# Patient Record
Sex: Female | Born: 1935 | Race: White | Hispanic: No | Smoking: Never smoker
Health system: Southern US, Community
[De-identification: ages and names within clinical notes are randomized; demographics above are authoritative.]

## PROBLEM LIST (undated history)

## (undated) DIAGNOSIS — I341 Nonrheumatic mitral (valve) prolapse: Secondary | ICD-10-CM

## (undated) DIAGNOSIS — R413 Other amnesia: Secondary | ICD-10-CM

## (undated) DIAGNOSIS — R51 Headache: Secondary | ICD-10-CM

## (undated) DIAGNOSIS — G4486 Cervicogenic headache: Secondary | ICD-10-CM

## (undated) DIAGNOSIS — I639 Cerebral infarction, unspecified: Secondary | ICD-10-CM

## (undated) DIAGNOSIS — M1990 Unspecified osteoarthritis, unspecified site: Secondary | ICD-10-CM

## (undated) DIAGNOSIS — K219 Gastro-esophageal reflux disease without esophagitis: Secondary | ICD-10-CM

## (undated) DIAGNOSIS — G629 Polyneuropathy, unspecified: Secondary | ICD-10-CM

## (undated) DIAGNOSIS — J45909 Unspecified asthma, uncomplicated: Secondary | ICD-10-CM

## (undated) DIAGNOSIS — M47812 Spondylosis without myelopathy or radiculopathy, cervical region: Secondary | ICD-10-CM

## (undated) DIAGNOSIS — Z87442 Personal history of urinary calculi: Secondary | ICD-10-CM

## (undated) DIAGNOSIS — D649 Anemia, unspecified: Secondary | ICD-10-CM

## (undated) DIAGNOSIS — J189 Pneumonia, unspecified organism: Secondary | ICD-10-CM

## (undated) DIAGNOSIS — R519 Headache, unspecified: Secondary | ICD-10-CM

## (undated) HISTORY — DX: Cervicogenic headache: G44.86

## (undated) HISTORY — PX: CERVICAL SPINE SURGERY: SHX589

## (undated) HISTORY — DX: Headache: R51

## (undated) HISTORY — DX: Unspecified asthma, uncomplicated: J45.909

## (undated) HISTORY — PX: ELBOW SURGERY: SHX618

## (undated) HISTORY — DX: Headache, unspecified: R51.9

## (undated) HISTORY — DX: Spondylosis without myelopathy or radiculopathy, cervical region: M47.812

## (undated) HISTORY — DX: Unspecified osteoarthritis, unspecified site: M19.90

## (undated) HISTORY — PX: CHOLECYSTECTOMY: SHX55

## (undated) HISTORY — PX: ABDOMINAL HYSTERECTOMY: SHX81

## (undated) HISTORY — DX: Gastro-esophageal reflux disease without esophagitis: K21.9

## (undated) HISTORY — DX: Nonrheumatic mitral (valve) prolapse: I34.1

## (undated) HISTORY — PX: BUNIONECTOMY: SHX129

## (undated) HISTORY — DX: Personal history of urinary calculi: Z87.442

## (undated) HISTORY — DX: Other amnesia: R41.3

## (undated) HISTORY — DX: Polyneuropathy, unspecified: G62.9

## (undated) HISTORY — PX: BACK SURGERY: SHX140

## (undated) HISTORY — PX: CATARACT EXTRACTION: SUR2

---

## 1998-05-13 ENCOUNTER — Encounter: Payer: Self-pay | Admitting: Family Medicine

## 1998-05-13 ENCOUNTER — Ambulatory Visit (HOSPITAL_COMMUNITY): Admission: RE | Admit: 1998-05-13 | Discharge: 1998-05-13 | Payer: Self-pay | Admitting: Family Medicine

## 1999-05-26 ENCOUNTER — Ambulatory Visit (HOSPITAL_COMMUNITY): Admission: RE | Admit: 1999-05-26 | Discharge: 1999-05-26 | Payer: Self-pay | Admitting: Family Medicine

## 1999-05-26 ENCOUNTER — Encounter: Payer: Self-pay | Admitting: Family Medicine

## 2000-05-26 ENCOUNTER — Ambulatory Visit (HOSPITAL_COMMUNITY): Admission: RE | Admit: 2000-05-26 | Discharge: 2000-05-26 | Payer: Self-pay | Admitting: Family Medicine

## 2000-05-26 ENCOUNTER — Encounter: Payer: Self-pay | Admitting: Family Medicine

## 2000-07-06 ENCOUNTER — Ambulatory Visit (HOSPITAL_COMMUNITY): Admission: RE | Admit: 2000-07-06 | Discharge: 2000-07-06 | Payer: Self-pay | Admitting: Gastroenterology

## 2000-07-06 ENCOUNTER — Encounter (INDEPENDENT_AMBULATORY_CARE_PROVIDER_SITE_OTHER): Payer: Self-pay

## 2000-09-07 ENCOUNTER — Encounter: Admission: RE | Admit: 2000-09-07 | Discharge: 2000-09-07 | Payer: Self-pay | Admitting: Gastroenterology

## 2000-09-07 ENCOUNTER — Encounter: Payer: Self-pay | Admitting: Gastroenterology

## 2001-05-30 ENCOUNTER — Encounter: Payer: Self-pay | Admitting: Family Medicine

## 2001-05-30 ENCOUNTER — Ambulatory Visit (HOSPITAL_COMMUNITY): Admission: RE | Admit: 2001-05-30 | Discharge: 2001-05-30 | Payer: Self-pay | Admitting: Family Medicine

## 2001-08-26 ENCOUNTER — Ambulatory Visit (HOSPITAL_COMMUNITY): Admission: RE | Admit: 2001-08-26 | Discharge: 2001-08-26 | Payer: Self-pay | Admitting: Family Medicine

## 2001-08-26 ENCOUNTER — Encounter: Payer: Self-pay | Admitting: Family Medicine

## 2002-05-31 ENCOUNTER — Ambulatory Visit (HOSPITAL_COMMUNITY): Admission: RE | Admit: 2002-05-31 | Discharge: 2002-05-31 | Payer: Self-pay | Admitting: Family Medicine

## 2002-05-31 ENCOUNTER — Encounter: Payer: Self-pay | Admitting: Family Medicine

## 2002-08-12 ENCOUNTER — Encounter: Payer: Self-pay | Admitting: Emergency Medicine

## 2002-08-12 ENCOUNTER — Emergency Department (HOSPITAL_COMMUNITY): Admission: EM | Admit: 2002-08-12 | Discharge: 2002-08-12 | Payer: Self-pay | Admitting: *Deleted

## 2003-06-04 ENCOUNTER — Ambulatory Visit (HOSPITAL_COMMUNITY): Admission: RE | Admit: 2003-06-04 | Discharge: 2003-06-04 | Payer: Self-pay | Admitting: Family Medicine

## 2003-09-17 ENCOUNTER — Other Ambulatory Visit: Admission: RE | Admit: 2003-09-17 | Discharge: 2003-09-17 | Payer: Self-pay | Admitting: Family Medicine

## 2003-10-10 ENCOUNTER — Ambulatory Visit (HOSPITAL_COMMUNITY): Admission: RE | Admit: 2003-10-10 | Discharge: 2003-10-10 | Payer: Self-pay | Admitting: Neurosurgery

## 2004-02-11 ENCOUNTER — Encounter: Admission: RE | Admit: 2004-02-11 | Discharge: 2004-02-11 | Payer: Self-pay | Admitting: Gastroenterology

## 2004-06-05 ENCOUNTER — Ambulatory Visit (HOSPITAL_COMMUNITY): Admission: RE | Admit: 2004-06-05 | Discharge: 2004-06-05 | Payer: Self-pay | Admitting: Family Medicine

## 2004-08-11 ENCOUNTER — Ambulatory Visit (HOSPITAL_COMMUNITY): Admission: RE | Admit: 2004-08-11 | Discharge: 2004-08-11 | Payer: Self-pay | Admitting: Family Medicine

## 2004-09-03 ENCOUNTER — Ambulatory Visit (HOSPITAL_COMMUNITY): Admission: RE | Admit: 2004-09-03 | Discharge: 2004-09-03 | Payer: Self-pay | Admitting: Neurosurgery

## 2004-09-22 ENCOUNTER — Encounter (INDEPENDENT_AMBULATORY_CARE_PROVIDER_SITE_OTHER): Payer: Self-pay | Admitting: *Deleted

## 2004-09-22 ENCOUNTER — Ambulatory Visit (HOSPITAL_COMMUNITY): Admission: RE | Admit: 2004-09-22 | Discharge: 2004-09-22 | Payer: Self-pay

## 2004-12-18 ENCOUNTER — Inpatient Hospital Stay (HOSPITAL_COMMUNITY): Admission: RE | Admit: 2004-12-18 | Discharge: 2004-12-19 | Payer: Self-pay | Admitting: Orthopaedic Surgery

## 2006-03-27 ENCOUNTER — Encounter: Admission: RE | Admit: 2006-03-27 | Discharge: 2006-03-27 | Payer: Self-pay | Admitting: Orthopaedic Surgery

## 2006-04-20 ENCOUNTER — Encounter: Payer: Self-pay | Admitting: Orthopaedic Surgery

## 2006-04-23 ENCOUNTER — Encounter (INDEPENDENT_AMBULATORY_CARE_PROVIDER_SITE_OTHER): Payer: Self-pay | Admitting: Specialist

## 2006-04-23 ENCOUNTER — Inpatient Hospital Stay (HOSPITAL_COMMUNITY): Admission: RE | Admit: 2006-04-23 | Discharge: 2006-04-24 | Payer: Self-pay | Admitting: Orthopaedic Surgery

## 2006-05-17 ENCOUNTER — Encounter: Admission: RE | Admit: 2006-05-17 | Discharge: 2006-05-17 | Payer: Self-pay | Admitting: Orthopaedic Surgery

## 2006-07-01 ENCOUNTER — Inpatient Hospital Stay (HOSPITAL_COMMUNITY): Admission: RE | Admit: 2006-07-01 | Discharge: 2006-07-04 | Payer: Self-pay | Admitting: Orthopaedic Surgery

## 2008-04-03 ENCOUNTER — Ambulatory Visit (HOSPITAL_COMMUNITY): Admission: RE | Admit: 2008-04-03 | Discharge: 2008-04-03 | Payer: Self-pay | Admitting: Family Medicine

## 2008-04-09 ENCOUNTER — Encounter: Admission: RE | Admit: 2008-04-09 | Discharge: 2008-04-09 | Payer: Self-pay | Admitting: Family Medicine

## 2009-10-08 ENCOUNTER — Encounter: Admission: RE | Admit: 2009-10-08 | Discharge: 2009-10-08 | Payer: Self-pay | Admitting: Family Medicine

## 2010-08-10 ENCOUNTER — Encounter: Payer: Self-pay | Admitting: Neurosurgery

## 2010-09-09 ENCOUNTER — Other Ambulatory Visit: Payer: Self-pay | Admitting: Surgery

## 2010-09-09 ENCOUNTER — Encounter (HOSPITAL_BASED_OUTPATIENT_CLINIC_OR_DEPARTMENT_OTHER)
Admission: RE | Admit: 2010-09-09 | Discharge: 2010-09-09 | Disposition: A | Discharge status: Home / Self Care | Payer: Medicare Other | Source: Ambulatory Visit | Attending: Surgery | Admitting: Surgery

## 2010-09-09 ENCOUNTER — Ambulatory Visit
Admission: RE | Admit: 2010-09-09 | Discharge: 2010-09-09 | Disposition: A | Discharge status: Home / Self Care | Payer: Medicare Other | Source: Ambulatory Visit | Attending: Surgery | Admitting: Surgery

## 2010-09-09 DIAGNOSIS — Z01810 Encounter for preprocedural cardiovascular examination: Secondary | ICD-10-CM | POA: Insufficient documentation

## 2010-09-09 DIAGNOSIS — Z01811 Encounter for preprocedural respiratory examination: Secondary | ICD-10-CM

## 2010-09-12 ENCOUNTER — Other Ambulatory Visit: Payer: Self-pay | Admitting: Surgery

## 2010-09-12 ENCOUNTER — Ambulatory Visit (HOSPITAL_BASED_OUTPATIENT_CLINIC_OR_DEPARTMENT_OTHER)
Admission: RE | Admit: 2010-09-12 | Discharge: 2010-09-12 | Disposition: A | Discharge status: Home / Self Care | Payer: Medicare Other | Source: Ambulatory Visit | Attending: Surgery | Admitting: Surgery

## 2010-09-12 DIAGNOSIS — D235 Other benign neoplasm of skin of trunk: Secondary | ICD-10-CM | POA: Insufficient documentation

## 2010-09-12 DIAGNOSIS — Z01818 Encounter for other preprocedural examination: Secondary | ICD-10-CM | POA: Insufficient documentation

## 2010-09-12 DIAGNOSIS — D1739 Benign lipomatous neoplasm of skin and subcutaneous tissue of other sites: Secondary | ICD-10-CM | POA: Insufficient documentation

## 2010-09-12 LAB — POCT HEMOGLOBIN-HEMACUE: Hemoglobin: 11.8 g/dL — ABNORMAL LOW (ref 12.0–15.0)

## 2010-09-14 NOTE — Op Note (Signed)
  NAMEBERNESE, DOFFING           ACCOUNT NO.:  [id number]  MEDICAL RECORD NO.:  [id number]            PATIENT TYPE:  LOCATION:                                 FACILITY:  PHYSICIAN:  Thornton Park. Daphine Deutscher, MD       DATE OF BIRTH:  DATE OF PROCEDURE:  09/12/2010 DATE OF DISCHARGE:                              OPERATIVE REPORT   PREOPERATIVE DIAGNOSIS:  Mass on the back and a nevus.  POSTOPERATIVE DIAGNOSIS:  Lipoma of the back and a nevus.  PROCEDURE:  Excision of lipoma and also excision of nevus, primary closure.  SURGEON:  Thornton Park. Daphine Deutscher, MD  ANESTHESIA:  Local MAC.  DESCRIPTION OF PROCEDURE:  This 77 year old lady was taken to room 8 at [facility] Day surgery on Friday, September 12, 2010.  She was placed in a lateral decubitus position left side down and the areas in question that had been previously marked and were painted with PCMX draped sterilely. They were infiltrated with 1% lidocaine.  A transverse incision was used to excise the lipoma, which came out in pieces, and was sent for permanence.  Appeared to be benign.  The other little nevus was down into the left with a midline, was cleared out after infiltrating with 1% lidocaine and with a longitudinal elliptical incision, and it was closed with 4-0 Vicryl subcutaneously.  Both were closed with Benzoin Steri- Strips.  The patient tolerated the procedure well and was taken to recovery room in satisfactory condition.  She was given a script for Vicodin to take for pain and will follow up in the office in about 3-4 weeks.     Thornton Park Daphine Deutscher, MD     MBM/MEDQ  D:  09/12/2010  T:  09/13/2010  Job:  [id number]  Electronically Signed by Luretha Murphy MD on 09/14/2010 05:00:16 PM

## 2010-12-05 NOTE — Op Note (Signed)
NAMEKAMIYA, ACORD             ACCOUNT NO.:  [id number]   MEDICAL RECORD NO.:  [id number]          PATIENT TYPE:  INP   LOCATION:  5021                         FACILITY:  [facility]   PHYSICIAN:  Sharolyn Douglas, M.D.        DATE OF BIRTH:  [birth year]   DATE OF PROCEDURE:  DATE OF DISCHARGE:  04/24/2006                                 OPERATIVE REPORT   DIAGNOSIS:  T9 compression fracture.   PROCEDURES:  1. T9 kyphoplasty.  2. Radiographic interpretation of fluoroscopic images used for      kyphoplasty.   SURGEON:  Sharolyn Douglas, MD.   ASSISTANT:  None.   ANESTHESIA:  General.   COMPLICATIONS:  None.   ESTIMATED BLOOD LOSS:  None.   INDICATIONS:  The patient is a pleasant 77 year old female with persistent  thoracic back pain.  She was found to have a T9 compression fracture with a  persistent cleft identified on MRI scan.  She has failed to respond to other  treatment modalities and now elects to undergo kyphoplasty at T9 in hopes of  improving her symptoms.  Risks, benefits and alternatives were reviewed.  She knows there are no guarantees.   PROCEDURE:  The patient was identified in the holding area.  After informed  consent, she was taken to the operating room.  She underwent general  endotracheal anesthesia without difficulty.  She was carefully turned prone  with bolsters under her chest and pelvis to bring about a postural reduction  of her kyphosis.  The back was prepped and draped in the usual sterile  fashion.  Biplanar fluoroscopy brought into the field.  True AP and lateral  images obtained of the T9 vertebral body.  Great care was taken to identify  this by counting the ribs and also counting up from the sacrum on the  lateral view.  Small stab incision made just to the lateral aspect of the  left T9 pedicle.  The Jamshidi needle was used to cannulate the T9 pedicle  under live biplanar fluoroscopy.  A guidewire was established and a working  cannula created into the  vertebral body through the pedicle.  We then took a  core bone biopsy and sent this to pathology.  The balloon tamp was then  inserted and inflated to 4 mL.  The pressure went up to 400 PSI.  The  balloon tamp was removed and partially hardened methyl methacrylate cement  was pushed through the working  cannula, a total of 4 mL.  We had a good cement fill.  The working cannula  was removed.  The small stab incision was closed with a single nylon suture.  Sterile dressing applied.  Patient was turned supine, transferred to  recovery in stable condition, able to move her upper and lower extremities.      Sharolyn Douglas, M.D.  Electronically Signed     MC/MEDQ  D:  04/23/2006  T:  04/25/2006  Job:  [id number]

## 2010-12-05 NOTE — Op Note (Signed)
NAMECHE, BELOW             ACCOUNT NO.:  [id number]   MEDICAL RECORD NO.:  [id number]          PATIENT TYPE:  AMB   LOCATION:  DAY                          FACILITY:  [facility]   PHYSICIAN:  Lorre Munroe., M.D.DATE OF BIRTH:  1933-03-30   DATE OF PROCEDURE:  09/22/2004  DATE OF DISCHARGE:                                 OPERATIVE REPORT   PREOPERATIVE DIAGNOSIS:  Symptomatic gallstones with chronic cholecystitis.   POSTOPERATIVE DIAGNOSIS:  Symptomatic gallstones with chronic cholecystitis.   OPERATION:  Laparoscopic cholecystectomy with operative cholangiogram.   SURGEON:  Lebron Conners, M.D.   ANESTHESIA:  General.   PROCEDURE:  After the patient was monitored and anesthetized and had routine  preparation and draping of the abdomen, I liberally infused local anesthetic  just below the umbilicus and subsequently at three additional sites for my  ports. I made a 2 cm vertical incision just below the umbilicus, dissected  down through the fat to the subcutaneous tissue, and identified the midline  fascia. Incision of the midline fascia was found to be slightly off midline  and there was one bit of scar tissue present due to previous laparoscopy. I  carefully worked through that until I came down for certain onto the  transversalis fascia and peritoneum and I  bluntly entered the peritoneal  cavity, put in my finger and made sure there were no visceral adhesions to  the anterior abdominal wall. I then placed a 0 Vicryl pursestring suture in  the fascia and secured the Hasson cannula and inflated the abdomen with CO2,  then laparoscopically examined the abdominal contents. Gallbladder was not  distended but showed evidence of chronic inflammation with some adhesions of  omentum to its undersurface. The liver appeared normal. The small intestine  and colon appeared normal. There were no pelvic adhesions or right lower  quadrant adhesions noted. I took particular care to  inspect that region  since the patient has had some chronic right lower quadrant pain separate  from the recent colicky type pain that she had been having. We then had her  positioned head up, foot down, tilted to the left, and placed three more  laparoscopic ports under direct vision. I then retracted the fundus of the  gallbladder toward the right shoulder and took down the adhesions with  cautery and blunt dissection. I grasped the infundibulum of the gallbladder  and pulled laterally and dissected around the distal part of the gallbladder  and cystic duct. I then placed a single clip on the cystic duct as it  emerged from the infundibulum of gallbladder and made a hole in the anterior  surface of it and put in a Cook cholangiogram catheter. I then performed a  fluoroscopic cholangiogram which showed normal biliary anatomy with free  flow of contrast into the duodenum and no strictures or evident stones or  other filling defects. I then put back the laparoscope, removed the  cholangiogram catheter. I placed three clips on the distal cystic duct and  divided it. I then dissected out a couple of small arteries consistent with  the cystic artery and  clipped and divided them. I dissected the gallbladder  from the liver using the cautery and got hemostasis with cautery. I  accidentally made one small hole in the gallbladder up near the fundus and  so after detaching the gallbladder from the liver, I placed it in a plastic  pouch and placed it above the liver. I then copiously irrigated the right  upper quadrant and removed the irrigant and looked at the clips and saw that  they were secure. I then inspected all the other port sites, particularly  the umbilical port site since it had been a slightly traumatic insertion of  port. I saw no bleeding or evidence of injury of viscera. I then removed the  gallbladder in its pouch through the umbilical incision and tied pursestring  suture, further  inspected the umbilical incision and found no evidence of  bleeding.  After removing the remaining fluid from the right upper quadrant, I removed  the lateral ports under direct vision and then allowed the CO2 to escape and  removed the other port. I closed all skin incisions with intracuticular 4-0  Vicryl and Steri-Strips. The patient tolerated the operation well, was  stable at the end of case.      WB/MEDQ  D:  09/22/2004  T:  09/22/2004  Job:  [id number]   cc:   Dario Guardian, M.D.  [address]  [place]  [place] [address]  Fax: [phone]   Llana Aliment. Malon Kindle., M.D.  [address], [address]  [place]  [place] [address]  Fax: [phone]

## 2010-12-05 NOTE — Procedures (Signed)
[facility]  Patient:    Theresa [name], Theresa [name]                    MRN: [id number] Proc. Date: 07/06/00 Adm. Date:  [id number] Attending:  Orland Mustard CC:         Dario Guardian, M.D.   Procedure Report  PROCEDURE:  Colonoscopy.  MEDICATIONS:  The patient received a total of 75 mcg of fentanyl and 8 mg of Versed for both procedures.  SCOPE:  Pediatric video colonoscope.  INDICATIONS:  Heme-positive stool and anemia.  DESCRIPTION OF PROCEDURE:  The procedure had been explained to the patient and consent obtained.  With the patient in the left lateral decubitus, the Olympus pediatric colonoscope was inserted and advanced under direct visualization. The prep was quite good.  The patient had a long tortuous colon.  Multiple maneuvers including position changes and abdominal pressure were required. Eventually we were able to reach the cecum, the ileocecal valve, the appendiceal orifice were seen.  The scope was withdrawn.  The cecum, ascending colon, hepatic flexure, transverse colon, splenic flexure, sigmoid and descending colon were seen well upon removal.  No significant diverticular disease or gross lesions.  The scope was withdrawn back into the proximal rectum and a 0.5 cm sessile polyp was snared and removed.  There was no bleeding.  No other bleeding or polyps, or tumors were seen in the rectum.  Internal hemorrhoids were seen in the anal canal upon removal.  They were moderate in size.  The scope was withdrawn.  The patient tolerated the procedure well.  ASSESSMENT: 1. Rectal polyp, removed. 2. Internal hemorrhoids, possibly the source of her heme positive stool.  PLAN: 1. We will see her back in the office in two months. 2. We will give hemorrhoid instructions and routine post polypectomy    instructions. DD:  07/06/00 TD:  07/07/00 Job: [id number]

## 2010-12-05 NOTE — Op Note (Signed)
NAMEALENA, Theresa [name]             ACCOUNT NO.:  [id number]   MEDICAL RECORD NO.:  [id number]          PATIENT TYPE:  INP   LOCATION:  5016                         FACILITY:  [facility]   PHYSICIAN:  Sharolyn Douglas, M.D.        DATE OF BIRTH:  May 10, 1933   DATE OF PROCEDURE:  07/01/2006  DATE OF DISCHARGE:                               OPERATIVE REPORT   PREOPERATIVE DIAGNOSES:  1. 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  2. Severe subadjacent cervical spondylosis C7-T1  3. Status post anterior cervical decompression fusion from C4-C7.  4. Chronic neck and left arm pain.   PROCEDURE:  1. Posterior cervical fusion from C4 down to the T2.  2. Segmental instrumentation using lateral mass and pedicle screws      from C4 down to T2 from the Abbott spine Nex-Link system.  3. Laminoforaminotomies C6-7 an C7-T1 bilaterally.  4. Right posterior iliac crest bone graft supplemented with 5 mL of      Grafton allograft.   SURGEON:  Sharolyn Douglas, MD.   ASSISTANTJill Side Mahar, PA   ANESTHESIA:  General endotracheal.   ESTIMATED BLOOD LOSS:  200 mL   COMPLICATIONS:  None.   SPONGE AND NEEDLE COUNT:  Correct.   INDICATIONS:  The patient is a pleasant 77 year old female who has had a  previous anterior cervical diskectomy and fusion from C4-C7.  She did  well postoperatively with resolution of her radicular pain and  temporarily her neck pain.  She has since redeveloped posterior cervical  thoracic discomfort radiating into the left arm.  Her imaging studies  show a pseudoarthrosis at C6-7.  She appears to have healed the C4-5 and  C5-6 levels.  In addition, there is severe subjacent degenerative  changes at C7-T1.  She has failed other conservative treatment  modalities and now elects for posterior cervical decompression and  fusion.  Risks, benefits, alternatives were reviewed.   PROCEDURE:  After informed consent she was taken to the operating room.  She underwent general endotracheal anesthesia without difficulty given  prophylactic IV antibiotics.  Neural monitoring was established in the  form of SSEPs and upper extremity EMGs.  The Mayfield tongs were placed  in the usual sterile fashion.  She was then turned prone onto the Montrose  frame.  The Mayfield was attached to the table using the attachment arm  with the neck in a neutral alignment.  The neck was then prepped and  draped in the usual sterile fashion along with the right posterior iliac  crest.  A midline incision was made from C3 down to T3.  Dissection was  carried through the nuchal ligament.  A subperiosteal exposure was  carried out to the edges of the lateral masses from C4 exposing the  transverse processes of T1 and T2 as well.  Intraoperative x-ray was  taken to confirm the levels.  The facette joints between C6 and C-7  were mobile consistent with a diagnosis of a pseudoarthrosis anteriorly.  The facette joints appeared to have fused between C4-5 and C5-6.  At  this point we turned our attention to performing the  laminoforaminotomies at C6-7 and C7-T1 bilaterally using a high-speed  bur and micro Kerrison punches.  Once the foraminotomies were completed  the pedicles could be palpated of C7 and T1 bilaterally.  We then turned  our attention to placing pedicle screws at T1 and T2 bilaterally.  Starting at T1 the pedicle could be palpated.  The hole was initiated  with the high-speed bur.  The pedicle was then cannulated using the  pedicle probe.  It was palpated and there were no breeches.  The pedicle  was tapped and we placed a 4.5 x 30 mm screw on the left side and a 4.5  x 25 mm screw on the right side.  We had excellent screw purchase.  We  performed a similar procedure at T2.  At this level we used 4.5 x 30 mm  screws bilaterally, again with excellent purchase.  We then turned our  attention to placing lateral mass screws at C4, C5 and C6 bilaterally  using a  standard drilling technique.  Each lateral mass was tapped and  we placed 3.5 x 12 mm screws in C4 andC5 and a 3.5 x 10 mm screw at C6  bilaterally.  At this point each screw was then stimulated using  triggered EMGs and there were no deleterious changes.  We turned our  attention to obtaining bone graft from the right posterior iliac crest.  A 2 cm incision was made directly over the PSIS.  Dissection was carried  through the deep fascia.  The cap of the PSIS was removed with a Leksell  rongeur and then we removed copious amounts of bone graft from between  the inner tables using a curette.  Gelfoam was left in the donor site.  The deep fascia closed with #1 Vicryl sutures.  Subcutaneous layer  closed with 2-0 Vicryl and then Dermabond on the skin edges.  We then  completed the posterior spinal fusion by decorticating the lateral  masses and transverse processes as well as the facette joints from C4  all the way down to T2.  The autogenous iliac crest bone graft was mixed  with 5 mL of Grafton allograft and packed into the facette joints and  over the lateral masses and transverse processes.  We then cut titanium  rods to length, bent them into lordosis and placed them into the  polyaxial screw heads.  The locking caps were applied and tightened.  Intraoperative x-ray was taken which demonstrated the top of the  instrumentation only. Hemostasis was achieved.  The wound was irrigated.  The deep fascia was closed with multiple interrupted #1 Vicryl sutures.  Subcutaneous layer closed with 2-0 Vicryl suture followed by a running 3-  0 subcuticular Vicryl suture on the skin edges.  Benzoin, Steri-Strips  placed.  Sterile dressing applied.  The patient was turned supine, the  Mayfield tongs were removed, the pin sites were intact.  There was no  bleeding.  She was placed into an Aspen cervical collar.  Extubated without difficulty and transferred to recovery in stable condition, able  to move  her upper and lower extremities.   It should be noted my assistant [facility], PA was present throughout  the procedure including the positioning, the exposure, the  decompression, fusion and the instrumentation.  She also assisted with  the bone graft and wound closure.      Sharolyn Douglas, M.D.  Electronically Signed  MC/MEDQ  D:  07/01/2006  T:  07/02/2006  Job:  [id number]

## 2010-12-05 NOTE — Consult Note (Signed)
Theresa [name]             ACCOUNT NO.:  [id number]   MEDICAL RECORD NO.:  [id number]          PATIENT TYPE:  INP   LOCATION:  NA                           FACILITY:  [facility]   PHYSICIAN:  Verlin Fester, P.A.    DATE OF BIRTH:  [birth year]   DATE OF CONSULTATION:  DATE OF DISCHARGE:                                 CONSULTATION   CHIEF COMPLAINT:  Neck and left scapula pain.   HISTORY OF PRESENT ILLNESS:  Patient is a 77 year old female who  underwent a ACDF back in 2006.  This was a 3 level ACDF that was done at  that time.  Two levels were non diffuse very well but unfortunately C6-7  appears to have not fused.  She has continued pain in her neck and pain  radiating to her left scapular area.  Because of these findings it was  thought she needed a revision fusion from posterior approach.  Risks and  benefits of the surgery were discussed with the patient by Dr. Noel Gerold as  well as myself.  She indicated understanding and opted to proceed.   ALLERGIES:  1. ZITHROMAX  2. AMITRIPTYLINE   MEDICATIONS:  None.   PAST MEDICAL HISTORY:  Pneumonia in 1996.   PAST SURGICAL HISTORY:  1. Cholecystectomy  2. ACDF at C4-C7  3. Right rotator cuff repair  4. Hysterectomy   SOCIAL HISTORY:  Patient denies tobacco use, denies alcohol use.  She  does live by herself but her children will be available to help her  through her postoperative course as needed.   FAMILY HISTORY:  Noncontributory.   REVIEW OF SYSTEMS:  Patient denies fever, chills, sweats, or bleeding  tendencies.  CNS:  Denies blurred vision, double vision, seizures,  headache, paralysis.  CARDIOVASCULAR:  Denies chesty pain, angina,  orthopnea, claudication, or palpitations.  PULMONARY:  Denies shortness  of breath, cough, and hemoptysis.  GI:  Denies nausea, vomiting,  constipation, diarrhea, melena, or bloody stool.  GU:  Denies dysuria,  hematuria or discharge.  MUSCULOSKELETAL:  As per HPI.   PHYSICAL EXAMINATION:   VITAL SIGNS:  Pulse 84 and regular, respirations  18 unlabored.  GENERAL:  This is a 77 year old white female who is alert and oriented  in no acute distress.  She is well-nourished, well-groomed, appears  stated age, cooperative to exam.  HEENT:  Normocephalic, atraumatic.  Pupils equal, round, reactive to  light.  Extraocular movements intact.  Nares patent, pharynx clear.  NECK:  Soft to palpation.  No lymphadenopathy, thyromegaly, or bruits  appreciated.  CHEST:  Clear to auscultation bilaterally.  No rales, rhonchi, or dry  wheeze, or friction rubs.  Breast no pertinent, not performed.  HEART:  S1 S2 regular rate and rhythm.  No murmurs, gallops, or rubs  noted.  ABDOMEN:  Soft to palpation, nontender, nondistended, no organomegaly  noted.  GU:  Not pertinent, not performed.  EXTREMITIES:  As per HPI.  SKIN:  Intact, no lesions or rashes.   IMPRESSION:  Pseudoarthrosis C6-7.   PLAN:  Admit to [facility] July 01, 2006 for posterior  cervical fusion  from C6-T2.  Surgeon will be Dr. Sharolyn Douglas.      Verlin Fester, P.A.     CM/MEDQ  D:  06/29/2006  T:  06/29/2006  Job:  [id number]   cc:   Sharolyn Douglas, M.D.

## 2010-12-05 NOTE — Procedures (Signed)
[facility]  Patient:    Theresa [name], Theresa [name]                    MRN: [id number] Proc. Date: 07/06/00 Adm. Date:  [id number] Attending:  Orland Mustard CC:         Dario Guardian, M.D.   Procedure Report  DATE OF BIRTH:  Aug 11, 1932  PROCEDURE PERFORMED:  Esophagoscopy and duodenoscopy.  ENDOSCOPIST:  Llana Aliment. Randa Evens, M.D.  MEDICATIONS:  Cetacaine spray, fentanyl 50 mcg and Versed 6 mg IV.  INDICATIONS:  Heme positive stool.  DESCRIPTION OF PROCEDURE:  With the procedure being explained to the patient, consent was obtained.  With the patient in the left lateral decubitus position, the Olympus video endoscope was inserted blindly into the esophagus and advanced under direct visualization.  The stomach was entered identifying the path.  The duodenum including the bulb and second portion were seen well. The scope was withdrawn back from the stomach, and the antrum and body were normal.  The fundus and cardia seen on retroflex view were normal.  There was a 2-3 cm hiatal hernia with what appeared to be a fairly patent GE junction. The distal and proximal esophagus were free of ulcerations or tumor masses. The vocal cords were seen well on passing and appeared normal.  The scope was withdrawn.  The patient tolerated the procedure well and was maintained on low flow oxygen and pulse oximeter throughout the procedure with no obvious problem.  ASSESSMENT:  Hiatal hernia, nothing else to explain the patients heme positive stool or anemia.  PLAN:  Will proceed at this time with colonoscopy. DD:  07/06/00 TD:  07/07/00 Job: [id number]

## 2010-12-05 NOTE — Discharge Summary (Signed)
[name], [name]             ACCOUNT NO.:  [id number]   MEDICAL RECORD NO.:  [id number]          PATIENT TYPE:  INP   LOCATION:  5016                         FACILITY:  [facility]   PHYSICIAN:  Sharolyn Douglas, M.D.        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   DISCHARGE DIAGNOSIS:  Status post C4-T2 posterior cervical fusion.   CONSULTATIONS:  None.   LABORATORY DATA:  Preop CBC with diff was normal.  Coag's were normal.  Complete metabolic panel was normal.  Urinalysis preop was negative.  Blood type is A+.  Antibodies screen was negative.  Urine culture showed  no growth.   X-rays from July 01, 2006, did show hardware extending posteriorly  from C4 down.  It was difficult to assess the inferior portion of the  hardware.  A CT scan on July 02, 2006, did show anterior plate and  diskectomy fusion from C4-C7 with a likely pseudoarthrosis at C6-7.  Posterior route fixation from C4-T2 in satisfactory condition.  On  July 04, 2006, cervical spine x-rays showed posterior fusion from C4-  T2.  No EKGs seen on the chart.   BRIEF HISTORY:  The patient is a 77 year old female who previously  underwent an ACDS at C4-C7.  It was thought that she had posterior  arthrosis, unfortunately at C6-7.  She had continued pain in her neck  that was radiating to her left scapula area.  Unfortunately she failed  to respond to conservative treatment for this pain.  It was thought her  best course of management would be a revision fusion of her posterior  approach.  The risks and benefits for this procedure were discussed with  the patient at length by Dr. Noel Gerold as well as myself.  She indicated  understanding and opted to proceed.   HOSPITAL COURSE:  On July 03, 2006, the patient was taken to the  operating room with above procedure.  She  tolerated the procedure well  without any intraoperative complications.  She was transferred to the  recovery room in stable condition.   Postoperatively, obtained orthopedic spine protocol as followed.  She  progressed along well through her postoperative course.   Physical therapy and occupational therapy worked with her on a daily  basis.  She progressed along well with them.  She was independent safe  with ambulation and activities of daily living prior to discharge.   Dr. Noel Gerold did order a CT scan of the cervical spine on July 02, 2006, to confirm screw placement.  Which as previously dictated, all of  this in good position and alignment.   By July 04, 2006, the patient had met her orthopedic goals.  She was  medically stable and ready for discharge home.   DISCHARGE PLAN:  The patient is a 77 year old female status post  posterior cervical fusion from C4-T2.  Activities:  Daily ambulation  program.  Brace on at all times.  No lifting over 5  pounds.  No overhead  activities.  Dressing changes of the neck daily, discontinue when all  drainage stops.  The patient may shower.   DIET:  Regular diet a tolerated.   FOLLOWUP:  Followup in 2 weeks postoperatively with Dr. Noel Gerold.   DISCHARGE MEDICATIONS:  1. Vicodin for pain.  2. Robaxin for muscle spasm.  3. Multivitamin daily.  4. Calcium daily.  5. Colace twice daily as needed.  6. Laxatives as needed.  7. Avoid NSAIDs x3 months.   CONDITION ON DISCHARGE:  Stable and improved.   DISPOSITION:  The patient is being discharged to her home with her  family's assistance.      Verlin Fester, P.A.      Sharolyn Douglas, M.D.  Electronically Signed    CM/MEDQ  D:  09/01/2006  T:  09/02/2006  Job:  [id number]   cc:   Sharolyn Douglas, M.D.

## 2010-12-05 NOTE — Op Note (Signed)
Theresa [name], Theresa [name]             ACCOUNT NO.:  [id number]   MEDICAL RECORD NO.:  [id number]          PATIENT TYPE:  OIB   LOCATION:  5021                         FACILITY:  [facility]   PHYSICIAN:  Sharolyn Douglas, M.D.        DATE OF BIRTH:  1933-02-26   DATE OF PROCEDURE:  12/18/2004  DATE OF DISCHARGE:                                 OPERATIVE REPORT   DIAGNOSIS:  Cervical degenerative disk disease and spondylosis.   PROCEDURES:  1.  Anterior cervical diskectomy C4-5, C5-6 and C6-7.  2.  Anterior cervical arthrodesis C4-5, C5-6, C6-7 with placement of three      allograft prosthesis spacers packed with local autogenous bone graft.  3.  Anterior cervical plating C4-C7 using the Abbott spine system.   SURGEON:  Sharolyn Douglas, M.D.   ASSISTANT:  PA   ANESTHESIA:  General endotracheal.   ESTIMATED BLOOD LOSS:  Minimal.   COMPLICATIONS:  None.   INDICATIONS:  The patient is a pleasant 77 year old female with  progressively worsening neck and bilateral upper extremity pain. She has  been refractory to all conservative treatment modalities. She has severe  degenerative disk disease and spondylotic changes, C4-5, C5-6, and C6-7 with  associated foraminal narrowing and mild spinal stenosis. She has elected to  undergo ACDF at these levels with allograft and plate in hopes of improving  her symptoms. Risks and benefits were reviewed.   PROCEDURE IN DETAIL:  The patient was identified in the holding area and  taken to the operating room. She underwent general endotracheal anesthesia  without difficulty. She was given prophylactic IV antibiotics. She was  carefully positioned on the operating room table with her neck in slight  extension and 5 pounds of halter traction applied. Neck was prepped and  draped usual sterile fashion and a 5 cm transverse incision was made over  the left side of the neck at the level of the cricoid cartilage. Dissection  was carried sharply through the platysma. The  interval between the SCM and  strap muscles medially was developed down the prevertebral space. The C5-6  and C6-7 levels were easily identifiable by large anterior osteophytes.  Intraoperative x-ray was taken to confirm this location. Deep shadow line  retractor was placed after elevating the longus colli muscle bilaterally.  The anterior osteophytes were removed with the Leksell rongeur. The  microscope was draped and brought into the field. Caspar distraction pins  were placed in the C4, C5, C6, and C7 vertebral bodies and gentle  distraction applied. Starting at C6-7, diskectomy was completed and carried  back to the posterior longitudinal ligament. The disk space was severely  degenerative and narrowed. High-speed bur was used to take down the  uncovertebral joints and posterior vertebral margins. Foraminotomies were  completed. Central disk herniation was removed using the micropituitary. We  then placed a 6 mm allograft prosthesis spacer which had been packed with  local bone graft obtained from the drill shavings. This was carefully  countersunk 1 mm. A similar procedure was then carried out at C5-6, and  again, the disk material was degenerative. Foraminotomies  were completed. A  5 mm allograft prosthesis spacer was used at this level and again packed  with the local bone graft. The procedure was again completed at C4-5. There  was more soft disk material at this level and a fragment was removed from  the left foramen. A 6 mm spacer was used and again packed with the local  bone graft. We then placed a 59 mm Abbott spine cervical plate from J1-O8.  Eight 12 mm screws were utilized. We had reasonable screw purchase,  considering the patient's age and bone quality. We ensured that the locking  mechanism engaged. The wound was irrigated and the esophagus, trachea,  carotid sheath were inspected. No apparent injuries. Intraoperative x-ray  showed appropriate positioning of the plate  screws and interbody spacers.  The deep Penrose drain was left in place. All bleeding was controlled.  Platysma was closed with interrupted 2-0 Vicryl.  The subcutaneous layer  closed with interrupted 3-0 Vicryl followed by a running 4-0 subcuticular  Vicryl suture on the skin edges. Benzoin and Steri-Strips were placed.  Sterile dressing applied.  Aspen cervical collar placed. The patient was  extubated without difficulty, transferred to recovery in stable condition,  and able to move her upper and lower extremities.       MC/MEDQ  D:  12/18/2004  T:  12/19/2004  Job:  [id number]

## 2012-04-01 ENCOUNTER — Other Ambulatory Visit: Payer: Self-pay | Admitting: Family Medicine

## 2012-04-01 DIAGNOSIS — N644 Mastodynia: Secondary | ICD-10-CM

## 2012-04-01 DIAGNOSIS — R234 Changes in skin texture: Secondary | ICD-10-CM

## 2012-04-04 ENCOUNTER — Ambulatory Visit
Admission: RE | Admit: 2012-04-04 | Discharge: 2012-04-04 | Disposition: A | Discharge status: Home / Self Care | Payer: Medicare Other | Source: Ambulatory Visit | Attending: Family Medicine | Admitting: Family Medicine

## 2012-04-04 ENCOUNTER — Other Ambulatory Visit: Payer: Self-pay | Admitting: Family Medicine

## 2012-04-04 DIAGNOSIS — N644 Mastodynia: Secondary | ICD-10-CM

## 2012-04-04 DIAGNOSIS — R234 Changes in skin texture: Secondary | ICD-10-CM

## 2012-07-06 ENCOUNTER — Encounter: Payer: Self-pay | Admitting: Neurology

## 2012-07-06 DIAGNOSIS — R519 Headache, unspecified: Secondary | ICD-10-CM | POA: Insufficient documentation

## 2012-07-06 DIAGNOSIS — M47812 Spondylosis without myelopathy or radiculopathy, cervical region: Secondary | ICD-10-CM | POA: Insufficient documentation

## 2012-07-07 ENCOUNTER — Other Ambulatory Visit: Payer: Self-pay | Admitting: Neurology

## 2012-07-07 DIAGNOSIS — R51 Headache: Secondary | ICD-10-CM

## 2012-07-07 DIAGNOSIS — M47812 Spondylosis without myelopathy or radiculopathy, cervical region: Secondary | ICD-10-CM

## 2012-07-15 ENCOUNTER — Ambulatory Visit
Admission: RE | Admit: 2012-07-15 | Discharge: 2012-07-15 | Disposition: A | Discharge status: Home / Self Care | Payer: Medicare Other | Source: Ambulatory Visit | Attending: Neurology | Admitting: Neurology

## 2012-07-15 DIAGNOSIS — R51 Headache: Secondary | ICD-10-CM

## 2012-07-15 DIAGNOSIS — M47812 Spondylosis without myelopathy or radiculopathy, cervical region: Secondary | ICD-10-CM

## 2012-09-02 ENCOUNTER — Encounter: Payer: Self-pay | Admitting: Neurology

## 2012-10-19 ENCOUNTER — Encounter: Payer: Self-pay | Admitting: Neurology

## 2012-10-19 ENCOUNTER — Ambulatory Visit (INDEPENDENT_AMBULATORY_CARE_PROVIDER_SITE_OTHER): Payer: Medicare Other | Admitting: Neurology

## 2012-10-19 VITALS — BP 143/82 | HR 87 | Ht 59.0 in | Wt 144.0 lb

## 2012-10-19 DIAGNOSIS — M47812 Spondylosis without myelopathy or radiculopathy, cervical region: Secondary | ICD-10-CM

## 2012-10-19 DIAGNOSIS — R51 Headache: Secondary | ICD-10-CM

## 2012-10-19 MED ORDER — TIZANIDINE HCL 4 MG PO TABS: 4.0000 mg | ORAL_TABLET | Freq: Two times a day (BID) | ORAL | Status: DC

## 2012-10-19 NOTE — Progress Notes (Signed)
Reason for visit: Headache  Theresa [name] is an 79 y.o. female  History of present illness:  Theresa [name] is a 79 year old right-handed white female with a history of significant cervical spondylosis and cervicogenic headache. The patient was placed on low-dose tizanidine on her last visit, and she was set up for neuromuscular therapy for her cervical spine and shoulder. The patient has gained some benefit with this, and she indicates that her headache issue has essentially resolved. The patient continues to have some neck discomfort, particularly on the right side. The patient indicates that when she turns her head to the right, it increases the pain significantly. The patient denies any discomfort down the arms. The patient has not had any change in her balance, or problems with control of the bowels or the bladder. The patient returns to this office for further evaluation. The patient was seen by a chiropractor, and x-rays were taken of the cervical spine. The patient was told that she had significant bone spurs affecting the neck.  Past Medical History  Diagnosis Date  . Cervical spondylosis   . Cervicogenic headache   . Mitral valve prolapse   . Asthma   . Gastroesophageal reflux disease   . History of renal calculi   . Arthritis     Degenerative    Past Surgical History  Procedure Laterality Date  . Cervical spine surgery      On 3 occasions  . Cholecystectomy    . Bunionectomy Right   . Elbow surgery      For bone spur  . Abdominal hysterectomy    . Cataract extraction      Family History  Problem Relation Age of Onset  . Heart attack Father   . Heart disease Sister   . Cancer Sister   . Coronary artery disease Brother   . Diabetes Sister   . Alzheimer's disease Brother   . Diabetes Brother     Social history:  reports that she has never smoked. She does not have any smokeless tobacco history on file. She reports that  drinks alcohol. Her drug history is not  on file.  Allergies:  Allergies  Allergen Reactions  . Amitriptyline Hcl     Elevation in muscle enzymes  . Clindamycin/Lincomycin     Causes esophagitis  . Zithromax (Azithromycin) Rash  . Capsaicin   . Ceclor (Cefaclor) Nausea Only    Medications:  No current outpatient prescriptions on file prior to visit.   No current facility-administered medications on file prior to visit.    ROS:  Out of a complete 14 system review of symptoms, the patient complains only of the following symptoms, and all other reviewed systems are negative.  Weight gain Easy bruising, easy bleeding Back pain Runny nose Anxiety  Blood pressure 143/82, pulse 87, height 4\' 11"  (1.499 m), weight 144 lb (65.318 kg).  Physical Exam  General: The patient is alert and cooperative at the time of the examination.  Neuromuscular: The patient has significant restriction of movement with the cervical spine, lacking 30 of full lateral rotation of the cervical spine bilaterally.   Skin: No significant peripheral edema is noted.   Neurologic Exam  Cranial nerves: Facial symmetry is present. Speech is normal, no aphasia or dysarthria is noted. Extraocular movements are full. Visual fields are full.  Motor: The patient has good strength in all 4 extremities.  Coordination: The patient has good finger-nose-finger and heel-to-shin bilaterally.  Gait and station: The patient has a  normal gait. Tandem gait is normal. Romberg is negative. No drift is seen.  Reflexes: Deep tendon reflexes are symmetric, but they are depressed throughout.   Assessment/Plan:  1. Cervical spondylosis  2. Cervicogenic headache  The patient has had good improvement in her headaches, but she continues to have neck discomfort. The patient is getting some benefit from the tizanidine, and she is tolerating the drug well. We will increase the dose to the 4 mg tablets taking 1 tablet twice daily. The patient will followup in 4 or 5  months.  Marlan Palau MD 10/19/2012 8:28 PM  [facility] [address] [place], [place] [address]  Phone [phone] Fax [phone]

## 2013-03-15 ENCOUNTER — Ambulatory Visit: Payer: Medicare Other | Admitting: Neurology

## 2013-05-12 ENCOUNTER — Other Ambulatory Visit: Payer: Self-pay

## 2013-05-12 ENCOUNTER — Ambulatory Visit
Admission: RE | Admit: 2013-05-12 | Discharge: 2013-05-12 | Disposition: A | Discharge status: Home / Self Care | Payer: Medicare Other | Source: Ambulatory Visit

## 2013-05-12 DIAGNOSIS — Z1231 Encounter for screening mammogram for malignant neoplasm of breast: Secondary | ICD-10-CM

## 2013-07-19 ENCOUNTER — Other Ambulatory Visit: Payer: Self-pay | Admitting: Dermatology

## 2014-01-22 ENCOUNTER — Ambulatory Visit (INDEPENDENT_AMBULATORY_CARE_PROVIDER_SITE_OTHER): Payer: Medicare Other

## 2014-01-22 ENCOUNTER — Ambulatory Visit (INDEPENDENT_AMBULATORY_CARE_PROVIDER_SITE_OTHER): Payer: Medicare Other | Admitting: Podiatry

## 2014-01-22 ENCOUNTER — Encounter: Payer: Self-pay | Admitting: Podiatry

## 2014-01-22 VITALS — BP 110/62 | HR 78 | Resp 18

## 2014-01-22 DIAGNOSIS — R52 Pain, unspecified: Secondary | ICD-10-CM

## 2014-01-22 DIAGNOSIS — M2021 Hallux rigidus, right foot: Secondary | ICD-10-CM

## 2014-01-22 DIAGNOSIS — M2020 Hallux rigidus, unspecified foot: Secondary | ICD-10-CM

## 2014-01-22 DIAGNOSIS — M779 Enthesopathy, unspecified: Secondary | ICD-10-CM

## 2014-01-22 NOTE — Progress Notes (Signed)
   Subjective:    Patient ID: Theresa [name], female    DOB: 11-Jan-1933, 80 y.o.   MRN: [id number]  HPI I HAD RIGHT FOOT SURGERY ON 05-19-13 AND IT WAS DONE LOCALLY AND I WENT BACK TO THE DOCTOR DUE TO MY FOOT WAS HURTING AND THE DOCTOR STATED THAT MY SHOES WERE TO TIGHT AND SWELLING AND THROBS AND BURNS AND IT GETS NUMB AND TINGLE AND IT DOESN'T FEEL REAL AT ALL    This patient presents today after right foot surgery in October 2014. Since his surgery she complains of a sensation like standing on a ball. In addition, she is complaining of some numbness and tingling localized more in the right forefoot area. This is resulting in patient's painful walking pattern and decreased physical activity  On 09/13/2003 I performed a cheilectomy of the right first metatarsophalangeal joint.  This patient was offered surgical treatment by Dr. Milinda Pointer on 04/07/2012 including a Keller bunionectomy with arthroplasty and second metatarsal osteotomy with screw and hammertoe repair. The patient states that it was her understanding that the second toe was going to be amputated and that's reason she went to another Dr. out of our practice  Patient admits that she takes Aleve 1 twice a day for multiple years for arthritic pain, however, this is not relieved her foot pain Review of Systems  Musculoskeletal: Positive for neck pain.       Left shoulder pain  Hematological: Bruises/bleeds easily.  All other systems reviewed and are negative.      Objective:   Physical Exam  Orientated x3 white female  Vascular: DP and PT pulses 2/4 bilaterally   Neurological: Ankle reflexes equal and reactive bilaterally Knee reflexes equal and reactive bilaterally Vibratory sensation intact bilaterally  Dermatological: Surgical scars noted dorsal medial right first MPJ, second and third MPJ Texture and turgor within normal limits, Low-grade edema noted right forefoot dorsally  Musculoskeletal: Manual motor testing :    Dorsi flexion, plantar flexion, inversion, eversion of the right and left feet 5/5 bilaterally Palpable tenderness plantar second, third, fourth MPJ without a palpable lesions. The fat-pad appears adequate. Tenderness and range of motion right first MPJ   X-ray report weightbearing right foot  Intact bony structure without fracture and/or dislocation noted  The first metatarsophalangeal joint is significantly narrowed  Retained internal screw fixation second and third metatarsal necks  Metatarsal parabola 2, 1, 3, 4, 5  Radiographic impression: No  No acute bony abnormality noted  Degenerative osteoarthritis first metatarsophalangeal joint     Assessment & Plan:   Assessment: Hallux limitus/ rigidus right Metatarsalgia/capsulitis MPJ area right Chronic pain resulting in altered gait pattern  Plan: I am placing patient in a Darco wedge shoe to offload her right forefoot area x6 weeks I would like to see if the symptoms reduce with relative rest and offloading. In addition I would consider a rocker sole shoe in the future.  Patient seen to be hesitant when I placed her in a Darco wedge shoe to wear and right foot. I emphasized the need to reduce standing walking and see if this would reduce some of her forefoot symptoms.  Reappoint x6 weeks

## 2014-01-23 ENCOUNTER — Encounter: Payer: Self-pay | Admitting: Podiatry

## 2014-01-25 ENCOUNTER — Encounter: Payer: Self-pay | Admitting: Interventional Cardiology

## 2014-01-25 DIAGNOSIS — I341 Nonrheumatic mitral (valve) prolapse: Secondary | ICD-10-CM | POA: Insufficient documentation

## 2014-01-25 DIAGNOSIS — M1990 Unspecified osteoarthritis, unspecified site: Secondary | ICD-10-CM | POA: Insufficient documentation

## 2014-01-25 DIAGNOSIS — Z87442 Personal history of urinary calculi: Secondary | ICD-10-CM | POA: Insufficient documentation

## 2014-01-25 DIAGNOSIS — J45909 Unspecified asthma, uncomplicated: Secondary | ICD-10-CM | POA: Insufficient documentation

## 2014-03-05 ENCOUNTER — Ambulatory Visit (INDEPENDENT_AMBULATORY_CARE_PROVIDER_SITE_OTHER): Payer: Medicare Other | Admitting: Podiatry

## 2014-03-05 ENCOUNTER — Encounter: Payer: Self-pay | Admitting: Podiatry

## 2014-03-05 VITALS — BP 97/58 | HR 89 | Resp 18

## 2014-03-05 DIAGNOSIS — M779 Enthesopathy, unspecified: Secondary | ICD-10-CM

## 2014-03-06 ENCOUNTER — Encounter: Payer: Self-pay | Admitting: Podiatry

## 2014-03-06 DIAGNOSIS — M779 Enthesopathy, unspecified: Secondary | ICD-10-CM

## 2014-03-06 MED ORDER — DEXAMETHASONE SODIUM PHOSPHATE 120 MG/30ML IJ SOLN
4.0000 mg | Freq: Once | INTRAMUSCULAR | Status: AC
  Administered 2014-03-06: 4 mg via INTRA_ARTICULAR

## 2014-03-06 NOTE — Progress Notes (Signed)
Patient ID: Theresa [name], female   DOB: Jul 26, 1932, 80 y.o.   MRN: [id number]  Subjective: This patient presents for followup visit of 01/22/2014. She admits shoes worn the Darco wedge shoe intermittently. The shoe has reduced some of the painful symptoms of the forefoot when she wears a shoe, however, she complains of right forefoot pain, burning and throbbing on and off weightbearing. Patient said surgical procedures on the right foot on 05/20/1999 410 and postoperatively the symptoms have developed i  Objective: Low-grade nonpitting edema dorsal right foot Skin texture and turgor within normal limits right foot No erythema, increased warmth or skin lesions other than well-healed surgical scars noted on the right foot Well-healed surgical scars right first MPJ second and third MPJ  tenderness to palpation in the first, second, and third right intermetatarsal spaces without any palpable lesions  Assessment: Postoperative arthralgia/capsulitis lesser MPJ right foot  Cannot rule out regional pain syndrome  Plan: Offered patient dexamethasone injection into the first, second, third right intermetatarsal spaces and she verbally consents  Foot is prepped with alcohol and Betadine and 10 mg of dexamethasone phosphate mixed with 10 mg of plain Marcaine were injected in divided doses into the first, second, third right intermetatarsal spaces Patient tolerated injections satisfactorily  Have patient wear the Darco wedge shoe on a as-needed basis   Reappoint x30 days

## 2014-04-02 ENCOUNTER — Encounter: Payer: Self-pay | Admitting: Podiatry

## 2014-04-02 ENCOUNTER — Ambulatory Visit (INDEPENDENT_AMBULATORY_CARE_PROVIDER_SITE_OTHER): Payer: Medicare Other | Admitting: Podiatry

## 2014-04-02 VITALS — BP 100/57 | HR 69 | Resp 18

## 2014-04-02 DIAGNOSIS — M2020 Hallux rigidus, unspecified foot: Secondary | ICD-10-CM

## 2014-04-02 DIAGNOSIS — M779 Enthesopathy, unspecified: Secondary | ICD-10-CM

## 2014-04-02 DIAGNOSIS — M2021 Hallux rigidus, right foot: Secondary | ICD-10-CM

## 2014-04-03 NOTE — Progress Notes (Signed)
Patient ID: Theresa [name], female   DOB: 07-25-1932, 79 y.o.   MRN: [id number]  Subjective:  HPI I HAD RIGHT FOOT SURGERY ON 05-19-13 AND IT WAS DONE LOCALLY AND I WENT BACK TO THE DOCTOR DUE TO MY FOOT WAS HURTING AND THE DOCTOR STATED THAT MY SHOES WERE TO TIGHT AND SWELLING AND THROBS AND BURNS AND IT GETS NUMB AND TINGLE AND IT DOESN'T FEEL REAL AT ALL  This patient presents complaining again that her right foot feels" like a club, stiff". And she describes some relief when she wears a postop surgical way shoe on the right foot. She describes several days of intense pain after the dexamethasone injection into the second and third right intermetatarsal spaces on the visit of 03/05/2014.  Objective: Orientated x3 white female  Vascular: DP and PT pulses 2/4 bilaterally Feet are warm to touch bilaterally  Neurological: Ankle reflex equal and reactive bilaterally Vibratory sensation intact bilaterally Sensation to 10 g monofilament wire intact bilaterally  Dermatological: Texture and turgor within normal limits bilaterally Well-healed surgical scars noted on the medial right first MPJ and second MPJ right  Musculoskeletal: Limited range of motion right first MPJ Dorsi flexion, plantar flexion, inversion, eversion 5/5 bilaterally Palpable tenderness plantar second third fourth MPJ and second, third, fourth right intermetatarsal spaces without any palpable lesions.  Assessment: Hallux rigidus right Chronic postoperative pain, right foot  Plan: At this time patient is advised that she has developed a chronic low-grade painful right foot postoperatively. I am recommending that she wears the surgical way shoe as needed She currently is taking Mobic 15 mg 4 proximal orthopedic pain Advised her to add on as needed acetaminophen only, no Aleve which is taking Mobic  If pain and if he increases I would consider referring her to a pain clinic if she wished. Patient is is declining  any pain medication or referral for pain control at this time  Reappoint at patient's request

## 2016-01-01 ENCOUNTER — Other Ambulatory Visit: Payer: Self-pay | Admitting: Dermatology

## 2016-02-06 ENCOUNTER — Telehealth: Payer: Self-pay | Admitting: Neurology

## 2016-02-06 NOTE — Telephone Encounter (Signed)
I called the patient. The patient fell recently, her head without loss of consciousness. The patient indicates that she does not feel well since the fall. The patient is not been seen here in over 3 years, we'll try to get her worked in as a new patient.

## 2016-02-06 NOTE — Telephone Encounter (Signed)
Pt called said she is having some HA's, tired-sleeping a lot, eyes feel gritty. She tripped outside while trimming trees and fell hitting her forehead and face and ending up flat on the ground. She did have some bleeding but nothing was broke. She said a neighbor was dx with a brain tumor and it was found because she had been hit by a limb she was trimming and needed an MRI.  This neighbor is not doing well. The pt is very concerned for herself at this time. Please call she would like to schedule and appt

## 2016-02-07 NOTE — Telephone Encounter (Signed)
Called and spoke to pt. Appt scheduled for next week.

## 2016-02-10 ENCOUNTER — Encounter: Payer: Self-pay | Admitting: Neurology

## 2016-02-10 ENCOUNTER — Ambulatory Visit (INDEPENDENT_AMBULATORY_CARE_PROVIDER_SITE_OTHER): Payer: Medicare Other | Admitting: Neurology

## 2016-02-10 VITALS — BP 123/83 | HR 73 | Ht <= 58 in | Wt 144.0 lb

## 2016-02-10 DIAGNOSIS — E538 Deficiency of other specified B group vitamins: Secondary | ICD-10-CM | POA: Diagnosis not present

## 2016-02-10 DIAGNOSIS — R413 Other amnesia: Secondary | ICD-10-CM

## 2016-02-10 DIAGNOSIS — G441 Vascular headache, not elsewhere classified: Secondary | ICD-10-CM

## 2016-02-10 HISTORY — DX: Other amnesia: R41.3

## 2016-02-10 NOTE — Progress Notes (Signed)
Reason for visit: Headache  Referring physician: Self referred  Theresa [name] is a 82 y.o. female  History of present illness:  Theresa [name] is an 82 year old right-handed white female with a history of cervical spondylosis, seen previously through this office over 3 years ago. The patient has been doing relatively well, but she had a fall about one week ago when she was outside. The patient made a turn, and one of her feet got stuck, and she fell over. The patient struck her face with the fall without loss of consciousness. Since that time, she has not felt well. She has had some increase in bifrontal headaches, and some dizziness. The patient has had a one-year history of some discomfort over the left brow, this may be associated with an itching sensation and a stinging sensation at times with discomfort going into the left temporal area. The patient continues to have this type of headache as well. She also reports a two-year history of mild memory problems that have gradually worsened over time, but this has become more severe over the last week. The patient has difficulty at times remembering where she is going with driving, she has general problems with short-term memory. She still is able to keep up with her finances, medications, and appointments. She denies any overt numbness or weakness of the extremities with exception that she has some problems using her hands secondary to arthritis. She does report some difficulty with balance, she has fallen twice over the last 4 weeks. The patient reports some occasional urinary incontinence. Given these symptoms, the patient comes to this office for an evaluation.  Past Medical History:  Diagnosis Date  . Arthritis    Degenerative  . Asthma   . Cervical spondylosis   . Cervicogenic headache   . Gastroesophageal reflux disease   . Headache   . History of renal calculi   . Mitral valve prolapse     Past Surgical History:  Procedure  Laterality Date  . ABDOMINAL HYSTERECTOMY    . BUNIONECTOMY Right   . CATARACT EXTRACTION    . CERVICAL SPINE SURGERY     On 3 occasions  . CHOLECYSTECTOMY    . ELBOW SURGERY     For bone spur    Family History  Problem Relation Age of Onset  . Heart attack Father   . Heart disease Sister   . Cancer Sister   . Coronary artery disease Brother   . Diabetes Sister   . Alzheimer's disease Brother   . Diabetes Brother     Social history:  reports that she has never smoked. She does not have any smokeless tobacco history on file. She reports that she drinks alcohol. Her drug history is not on file.  Medications:  Prior to Admission medications   Medication Sig Start Date End Date Taking? Authorizing Provider  diclofenac sodium (VOLTAREN) 1 % GEL Apply 2 g topically 2 (two) times daily as needed.    Historical Provider, MD  meloxicam (MOBIC) 15 MG tablet  02/22/14   Historical Provider, MD  tiZANidine (ZANAFLEX) 4 MG tablet Take 1 tablet (4 mg total) by mouth 2 (two) times daily. 10/19/12   Kathrynn Ducking, MD      Allergies  Allergen Reactions  . Amitriptyline Hcl     Elevation in muscle enzymes  . Clindamycin/Lincomycin     Causes esophagitis  . Zithromax [Azithromycin] Rash  . Capsaicin   . Ceclor [Cefaclor] Nausea Only  ROS:  Out of a complete 14 system review of symptoms, the patient complains only of the following symptoms, and all other reviewed systems are negative.  Fatigue  Swelling in the legs  Blurred vision  Easy bruising    Blood pressure 123/83, pulse 73, height 4\' 10"  (1.473 m), weight 144 lb (65.3 kg).  Physical Exam  General: The patient is alert and cooperative at the time of the examination.  Eyes: Pupils are equal, round, and reactive to light. Discs are flat bilaterally.  Neck: The neck is supple, no carotid bruits are noted.  Respiratory: The respiratory examination is clear.  Cardiovascular: The cardiovascular examination reveals a  regular rate and rhythm, no obvious murmurs or rubs are noted.  Skin: Extremities are without significant edema.  Neurologic Exam  Mental status: The patient is alert and oriented x 3 at the time of the examination. The patient has apparent normal recent and remote memory, with an apparently normal attention span and concentration ability. Mini-Mental Status Examination done today shows a total score 28/30.  Cranial nerves: Facial symmetry is present. There is good sensation of the face to pinprick and soft touch bilaterally. The strength of the facial muscles and the muscles to head turning and shoulder shrug are normal bilaterally. Speech is well enunciated, no aphasia or dysarthria is noted. Extraocular movements are full. Visual fields are full. The tongue is midline, and the patient has symmetric elevation of the soft palate. No obvious hearing deficits are noted.  Motor: The motor testing reveals 5 over 5 strength of all 4 extremities. Good symmetric motor tone is noted throughout.  Sensory: Sensory testing is intact to pinprick, soft touch, vibration sensation, and position sense on all 4 extremities. No evidence of extinction is noted.  Coordination: Cerebellar testing reveals good finger-nose-finger and heel-to-shin bilaterally.  Gait and station: Gait is normal. Tandem gait is normal. Romberg is negative. No drift is seen.  Reflexes: Deep tendon reflexes are symmetric and normal bilaterally. Toes are downgoing bilaterally.   Assessment/Plan:  1. Recent fall, headache   2. Reported mild memory disturbance   3. Reported mild gait disturbance   The patient has had a recent fall, some increase in headache has been noted. The patient has had some left-sided headaches, sharp pains in the left eye at times dating back one year. She has seen an ophthalmologist for this. The patient will be set up for MRI evaluation of the brain, the patient will have blood work today. The memory issues  will be followed over time. She will follow-up in 6 months, sooner if needed. If the headaches are significant, medical therapy may be necessary.   Jill Alexanders MD 02/10/2016 7:48 AM  [facility] [address] [place], [place] [address]  Phone [phone] Fax [phone]

## 2016-02-10 NOTE — Patient Instructions (Addendum)
   We will check blood work today and get MRI of the brain. General Headache Without Cause A headache is pain or discomfort felt around the head or neck area. The specific cause of a headache may not be found. There are many causes and types of headaches. A few common ones are:  Tension headaches.  Migraine headaches.  Cluster headaches.  Chronic daily headaches. HOME CARE INSTRUCTIONS  Watch your condition for any changes. Take these steps to help with your condition: Managing Pain  Take over-the-counter and prescription medicines only as told by your health care provider.  Lie down in a dark, quiet room when you have a headache.  If directed, apply ice to the head and neck area:  Put ice in a plastic bag.  Place a towel between your skin and the bag.  Leave the ice on for 20 minutes, 2-3 times per day.  Use a heating pad or hot shower to apply heat to the head and neck area as told by your health care provider.  Keep lights dim if bright lights bother you or make your headaches worse. Eating and Drinking  Eat meals on a regular schedule.  Limit alcohol use.  Decrease the amount of caffeine you drink, or stop drinking caffeine. General Instructions  Keep all follow-up visits as told by your health care provider. This is important.  Keep a headache journal to help find out what may trigger your headaches. For example, write down:  What you eat and drink.  How much sleep you get.  Any change to your diet or medicines.  Try massage or other relaxation techniques.  Limit stress.  Sit up straight, and do not tense your muscles.  Do not use tobacco products, including cigarettes, chewing tobacco, or e-cigarettes. If you need help quitting, ask your health care provider.  Exercise regularly as told by your health care provider.  Sleep on a regular schedule. Get 7-9 hours of sleep, or the amount recommended by your health care provider. SEEK MEDICAL CARE IF:    Your symptoms are not helped by medicine.  You have a headache that is different from the usual headache.  You have nausea or you vomit.  You have a fever. SEEK IMMEDIATE MEDICAL CARE IF:   Your headache becomes severe.  You have repeated vomiting.  You have a stiff neck.  You have a loss of vision.  You have problems with speech.  You have pain in the eye or ear.  You have muscular weakness or loss of muscle control.  You lose your balance or have trouble walking.  You feel faint or pass out.  You have confusion.   This information is not intended to replace advice given to you by your health care provider. Make sure you discuss any questions you have with your health care provider.   Document Released: 07/06/2005 Document Revised: 03/27/2015 Document Reviewed: 10/29/2014 Elsevier Interactive Patient Education [employer].

## 2016-02-12 ENCOUNTER — Telehealth: Payer: Self-pay

## 2016-02-12 ENCOUNTER — Ambulatory Visit: Payer: Self-pay | Admitting: Neurology

## 2016-02-12 LAB — COPPER, SERUM: Copper: 105 ug/dL (ref 72–166)

## 2016-02-12 LAB — SEDIMENTATION RATE: Sed Rate: 15 mm/hr (ref 0–40)

## 2016-02-12 LAB — RPR: RPR Ser Ql: NONREACTIVE

## 2016-02-12 LAB — VITAMIN B12: Vitamin B-12: 422 pg/mL (ref 211–946)

## 2016-02-12 NOTE — Telephone Encounter (Signed)
-----   Message from Kathrynn Ducking, MD sent at 02/12/2016  7:16 AM EDT -----  The blood work results are unremarkable. Please call the patient.

## 2016-02-12 NOTE — Telephone Encounter (Signed)
Called pt w/ unremarkable lab results. May call back w/ additional questions/concerns. 

## 2016-02-19 ENCOUNTER — Ambulatory Visit
Admission: RE | Admit: 2016-02-19 | Discharge: 2016-02-19 | Disposition: A | Discharge status: Home / Self Care | Payer: Medicare Other | Source: Ambulatory Visit | Attending: Neurology | Admitting: Neurology

## 2016-02-19 DIAGNOSIS — G441 Vascular headache, not elsewhere classified: Secondary | ICD-10-CM

## 2016-02-19 DIAGNOSIS — R413 Other amnesia: Secondary | ICD-10-CM | POA: Diagnosis not present

## 2016-02-20 ENCOUNTER — Telehealth: Payer: Self-pay | Admitting: Neurology

## 2016-02-20 NOTE — Telephone Encounter (Signed)
  I called patient. MRI shows mild small vessel changes, no change since 2013. The patient should be on low-dose aspirin if she is not already taking this.  MRI brain 02/20/16:  IMPRESSION:  Abnormal MRI brain (without) demonstrating: 1. Multiple periventricular, subcortical and right superior cerebellar T2 hyperintense lesions, likely related to chronic small vessel ischemic disease.  2. No acute findings. 3. No significant change compared to MRI on 07/15/12.

## 2016-02-24 ENCOUNTER — Other Ambulatory Visit: Payer: Self-pay | Admitting: Physician Assistant

## 2016-02-24 DIAGNOSIS — L989 Disorder of the skin and subcutaneous tissue, unspecified: Secondary | ICD-10-CM

## 2016-02-26 ENCOUNTER — Ambulatory Visit
Admission: RE | Admit: 2016-02-26 | Discharge: 2016-02-26 | Disposition: A | Discharge status: Home / Self Care | Payer: Medicare Other | Source: Ambulatory Visit | Attending: Physician Assistant | Admitting: Physician Assistant

## 2016-02-26 DIAGNOSIS — L989 Disorder of the skin and subcutaneous tissue, unspecified: Secondary | ICD-10-CM

## 2016-08-12 ENCOUNTER — Ambulatory Visit (INDEPENDENT_AMBULATORY_CARE_PROVIDER_SITE_OTHER): Payer: Medicare Other | Admitting: Adult Health

## 2016-08-12 ENCOUNTER — Encounter: Payer: Self-pay | Admitting: Adult Health

## 2016-08-12 VITALS — BP 117/67 | HR 70 | Ht <= 58 in | Wt 144.8 lb

## 2016-08-12 DIAGNOSIS — M47812 Spondylosis without myelopathy or radiculopathy, cervical region: Secondary | ICD-10-CM | POA: Diagnosis not present

## 2016-08-12 DIAGNOSIS — R51 Headache: Secondary | ICD-10-CM | POA: Diagnosis not present

## 2016-08-12 DIAGNOSIS — R519 Headache, unspecified: Secondary | ICD-10-CM

## 2016-08-12 NOTE — Progress Notes (Signed)
PATIENT: Theresa [name] DOB: 09-03-1932  REASON FOR VISIT: follow up HISTORY FROM: patient  HISTORY OF PRESENT ILLNESS: Theresa [name] is an 83 year old female with a history of cervical spondylosis and headaches. She returns today for follow-up. She states that her headaches have continued. She states that they do not occur every day but several times a week. She states that the headaches usually occur in the occipital region on the left side. She describes the pain as a sharp shooting pain. Sometimes it will radiate behind the left ear. She reports that sometimes the pain also radiates from the neck. She reports most of time the pain is very quick she has had occasions where the pain lingers. She denies photophobia and phonophobia, nausea and vomiting. She feels that her memory has remained stable. She lives at home alone. She completes all ADLs and apparently. Denies any trouble driving. She is very active in her church and volunteers. She returns today for an evaluation.  HISTORY 02/10/16:Theresa [name] is an 83 year old right-handed white female with a history of cervical spondylosis, seen previously through this office over 3 years ago. The patient has been doing relatively well, but she had a fall about one week ago when she was outside. The patient made a turn, and one of her feet got stuck, and she fell over. The patient struck her face with the fall without loss of consciousness. Since that time, she has not felt well. She has had some increase in bifrontal headaches, and some dizziness. The patient has had a one-year history of some discomfort over the left brow, this may be associated with an itching sensation and a stinging sensation at times with discomfort going into the left temporal area. The patient continues to have this type of headache as well. She also reports a two-year history of mild memory problems that have gradually worsened over time, but this has become more severe over the  last week. The patient has difficulty at times remembering where she is going with driving, she has general problems with short-term memory. She still is able to keep up with her finances, medications, and appointments. She denies any overt numbness or weakness of the extremities with exception that she has some problems using her hands secondary to arthritis. She does report some difficulty with balance, she has fallen twice over the last 4 weeks. The patient reports some occasional urinary incontinence. Given these symptoms, the patient comes to this office for an evaluation.   REVIEW OF SYSTEMS: Out of a complete 14 system review of symptoms, the patient complains only of the following symptoms, and all other reviewed systems are negative. Abdominal pain, joint pain, joint swelling, back pain, muscle cramps, neck pain, neck stiffness, leg swelling, air pain, runny nose, eye pain, headache, bruise/bleed easily    ALLERGIES: Allergies  Allergen Reactions  . Amitriptyline Hcl     Elevation in muscle enzymes  . Clindamycin/Lincomycin     Causes esophagitis  . Zithromax [Azithromycin] Rash  . Capsaicin   . Ceclor [Cefaclor] Nausea Only    HOME MEDICATIONS: Outpatient Medications Prior to Visit  Medication Sig Dispense Refill  . diclofenac sodium (VOLTAREN) 1 % GEL Apply 2 g topically 2 (two) times daily as needed.    Marland Kitchen ibuprofen (ADVIL,MOTRIN) 200 MG tablet Take 200 mg by mouth every 6 (six) hours as needed.     No facility-administered medications prior to visit.     PAST MEDICAL HISTORY: Past Medical History:  Diagnosis Date  .  Arthritis    Degenerative  . Asthma   . Cervical spondylosis   . Cervicogenic headache   . Gastroesophageal reflux disease   . Headache   . History of renal calculi   . Memory difficulties 02/10/2016  . Mitral valve prolapse     PAST SURGICAL HISTORY: Past Surgical History:  Procedure Laterality Date  . ABDOMINAL HYSTERECTOMY    . BUNIONECTOMY  Right   . CATARACT EXTRACTION    . CERVICAL SPINE SURGERY     On 3 occasions  . CHOLECYSTECTOMY    . ELBOW SURGERY     For bone spur    FAMILY HISTORY: Family History  Problem Relation Age of Onset  . Heart attack Father   . Heart disease Sister   . Cancer Sister   . Coronary artery disease Brother   . Diabetes Sister   . Alzheimer's disease Brother   . Diabetes Brother     SOCIAL HISTORY: Social History   Social History  . Marital status: Single    Spouse name: N/A  . Number of children: N/A  . Years of education: Some col   Occupational History  . Retired    Social History Main Topics  . Smoking status: Never Smoker  . Smokeless tobacco: Not on file  . Alcohol use 0.0 oz/week     Comment: drinks alcohol on occasion  . Drug use: Unknown  . Sexual activity: Not on file   Other Topics Concern  . Not on file   Social History Narrative   Lives at home alone.   Right-handed.   2-3 cups caffeine per day.      PHYSICAL EXAM  Vitals:   08/12/16 0827  BP: 117/67  Pulse: 70  Weight: 144 lb 12.8 oz (65.7 kg)  Height: 4\' 10"  (1.473 m)   Body mass index is 30.26 kg/m.  Generalized: Well developed, in no acute distress   Neurological examination  Mentation: Alert oriented to time, place, history taking. Follows all commands speech and language fluent Cranial nerve II-XII: Pupils were equal round reactive to light. Extraocular movements were full, visual field were full on confrontational test. Facial sensation and strength were normal. Uvula tongue midline. Head turning and shoulder shrug  were normal and symmetric. Motor: The motor testing reveals 5 over 5 strength of all 4 extremities. Good symmetric motor tone is noted throughout.  Sensory: Sensory testing is intact to soft touch on all 4 extremities. No evidence of extinction is noted.  Coordination: Cerebellar testing reveals good finger-nose-finger and heel-to-shin bilaterally.  Gait and station: Gait  is normal. Tandem gait is normal. Romberg is negative. No drift is seen.  Reflexes: Deep tendon reflexes are symmetric and normal bilaterally.   DIAGNOSTIC DATA (LABS, IMAGING, TESTING) - I reviewed patient records, labs, notes, testing and imaging myself where available.    ASSESSMENT AND PLAN 83 y.o. year old female  has a past medical history of Arthritis; Asthma; Cervical spondylosis; Cervicogenic headache; Gastroesophageal reflux disease; Headache; History of renal calculi; Memory difficulties (02/10/2016); and Mitral valve prolapse. here with:  1. Cervicogenic headache versus occipital neuralgia  I discussed with the patient about starting medication possibly gabapentin. However the patient states that she feels that her headaches are tolerable at this time. She does not want to go on a daily medication. I advised that if her headache frequency or severity worsens she should let us know. She will follow-up in 6 months or sooner if needed.     Jinny Blossom  Clabe Seal, MSN, NP-C 08/12/2016, 8:25 AM [facility] [address] [place], [place] [address] [phone]

## 2016-08-12 NOTE — Progress Notes (Signed)
I have read the note, and I agree with the clinical assessment and plan.  Theresa [name]   

## 2016-08-12 NOTE — Patient Instructions (Signed)
Please let me know if pain becomes unbearable We can try gabapentin to relieve pain Gabapentin capsules or tablets What is this medicine? GABAPENTIN (GA ba pen tin) is used to control partial seizures in adults with epilepsy. It is also used to treat certain types of nerve pain. This medicine may be used for other purposes; ask your health care provider or pharmacist if you have questions. COMMON BRAND NAME(S): Active-PAC with Gabapentin, Gabarone, Neurontin What should I tell my health care provider before I take this medicine? They need to know if you have any of these conditions: -kidney disease -suicidal thoughts, plans, or attempt; a previous suicide attempt by you or a family member -an unusual or allergic reaction to gabapentin, other medicines, foods, dyes, or preservatives -pregnant or trying to get pregnant -breast-feeding How should I use this medicine? Take this medicine by mouth with a glass of water. Follow the directions on the prescription label. You can take it with or without food. If it upsets your stomach, take it with food.Take your medicine at regular intervals. Do not take it more often than directed. Do not stop taking except on your doctor's advice. If you are directed to break the 600 or 800 mg tablets in half as part of your dose, the extra half tablet should be used for the next dose. If you have not used the extra half tablet within 28 days, it should be thrown away. A special MedGuide will be given to you by the pharmacist with each prescription and refill. Be sure to read this information carefully each time. Talk to your pediatrician regarding the use of this medicine in children. Special care may be needed. Overdosage: If you think you have taken too much of this medicine contact a poison control center or emergency room at once. NOTE: This medicine is only for you. Do not share this medicine with others. What if I miss a dose? If you miss a dose, take it as  soon as you can. If it is almost time for your next dose, take only that dose. Do not take double or extra doses. What may interact with this medicine? Do not take this medicine with any of the following medications: -other gabapentin products This medicine may also interact with the following medications: -alcohol -antacids -antihistamines for allergy, cough and cold -certain medicines for anxiety or sleep -certain medicines for depression or psychotic disturbances -homatropine; hydrocodone -naproxen -narcotic medicines (opiates) for pain -phenothiazines like chlorpromazine, mesoridazine, prochlorperazine, thioridazine This list may not describe all possible interactions. Give your health care provider a list of all the medicines, herbs, non-prescription drugs, or dietary supplements you use. Also tell them if you smoke, drink alcohol, or use illegal drugs. Some items may interact with your medicine. What should I watch for while using this medicine? Visit your doctor or health care professional for regular checks on your progress. You may want to keep a record at home of how you feel your condition is responding to treatment. You may want to share this information with your doctor or health care professional at each visit. You should contact your doctor or health care professional if your seizures get worse or if you have any new types of seizures. Do not stop taking this medicine or any of your seizure medicines unless instructed by your doctor or health care professional. Stopping your medicine suddenly can increase your seizures or their severity. Wear a medical identification bracelet or chain if you are taking this medicine for  seizures, and carry a card that lists all your medications. You may get drowsy, dizzy, or have blurred vision. Do not drive, use machinery, or do anything that needs mental alertness until you know how this medicine affects you. To reduce dizzy or fainting spells, do  not sit or stand up quickly, especially if you are an older patient. Alcohol can increase drowsiness and dizziness. Avoid alcoholic drinks. Your mouth may get dry. Chewing sugarless gum or sucking hard candy, and drinking plenty of water will help. The use of this medicine may increase the chance of suicidal thoughts or actions. Pay special attention to how you are responding while on this medicine. Any worsening of mood, or thoughts of suicide or dying should be reported to your health care professional right away. Women who become pregnant while using this medicine may enroll in the [place] Pregnancy Registry by calling [phone]. This registry collects information about the safety of antiepileptic drug use during pregnancy. What side effects may I notice from receiving this medicine? Side effects that you should report to your doctor or health care professional as soon as possible: -allergic reactions like skin rash, itching or hives, swelling of the face, lips, or tongue -worsening of mood, thoughts or actions of suicide or dying Side effects that usually do not require medical attention (report to your doctor or health care professional if they continue or are bothersome): -constipation -difficulty walking or controlling muscle movements -dizziness -nausea -slurred speech -tiredness -tremors -weight gain This list may not describe all possible side effects. Call your doctor for medical advice about side effects. You may report side effects to FDA at 1-800-FDA-1088. Where should I keep my medicine? Keep out of reach of children. This medicine may cause accidental overdose and death if it taken by other adults, children, or pets. Mix any unused medicine with a substance like cat litter or coffee grounds. Then throw the medicine away in a sealed container like a sealed bag or a coffee can with a lid. Do not use the medicine after the expiration date. Store at  room temperature between 15 and 30 degrees C (59 and 86 degrees F). NOTE: This sheet is a summary. It may not cover all possible information. If you have questions about this medicine, talk to your doctor, pharmacist, or health care provider.  2017 Elsevier/Gold Standard (2013-09-01 15:26:50)

## 2017-02-11 ENCOUNTER — Ambulatory Visit: Payer: Medicare Other | Admitting: Adult Health

## 2017-03-24 ENCOUNTER — Encounter (HOSPITAL_COMMUNITY): Payer: Self-pay | Admitting: Emergency Medicine

## 2017-03-24 ENCOUNTER — Emergency Department (HOSPITAL_COMMUNITY): Payer: Medicare Other

## 2017-03-24 ENCOUNTER — Emergency Department (HOSPITAL_COMMUNITY)
Admission: EM | Admit: 2017-03-24 | Discharge: 2017-03-25 | Discharge status: Against Medical Advice | Payer: Medicare Other | Attending: Emergency Medicine | Admitting: Emergency Medicine

## 2017-03-24 DIAGNOSIS — J45909 Unspecified asthma, uncomplicated: Secondary | ICD-10-CM | POA: Insufficient documentation

## 2017-03-24 DIAGNOSIS — Z23 Encounter for immunization: Secondary | ICD-10-CM | POA: Insufficient documentation

## 2017-03-24 DIAGNOSIS — Y998 Other external cause status: Secondary | ICD-10-CM | POA: Insufficient documentation

## 2017-03-24 DIAGNOSIS — S61452A Open bite of left hand, initial encounter: Secondary | ICD-10-CM | POA: Diagnosis present

## 2017-03-24 DIAGNOSIS — Z9049 Acquired absence of other specified parts of digestive tract: Secondary | ICD-10-CM | POA: Insufficient documentation

## 2017-03-24 DIAGNOSIS — Y92512 Supermarket, store or market as the place of occurrence of the external cause: Secondary | ICD-10-CM | POA: Diagnosis not present

## 2017-03-24 DIAGNOSIS — Y9389 Activity, other specified: Secondary | ICD-10-CM | POA: Diagnosis not present

## 2017-03-24 DIAGNOSIS — W5501XA Bitten by cat, initial encounter: Secondary | ICD-10-CM | POA: Diagnosis not present

## 2017-03-24 MED ORDER — AMOXICILLIN-POT CLAVULANATE 875-125 MG PO TABS
1.0000 | ORAL_TABLET | Freq: Once | ORAL | Status: AC
  Administered 2017-03-24: 1 via ORAL
  Filled 2017-03-24: qty 1

## 2017-03-24 MED ORDER — TETANUS-DIPHTH-ACELL PERTUSSIS 5-2.5-18.5 LF-MCG/0.5 IM SUSP
0.5000 mL | Freq: Once | INTRAMUSCULAR | Status: AC
  Administered 2017-03-24: 0.5 mL via INTRAMUSCULAR
  Filled 2017-03-24: qty 0.5

## 2017-03-24 NOTE — ED Triage Notes (Signed)
Pt reports trying to feed kitten at [place] [facility] [employer], cat bit her on R arm and L hand, mild swelling noted. Unknown about rabies, tetanus shot not up to date.

## 2017-03-24 NOTE — ED Notes (Signed)
Patient transported to X-ray 

## 2017-03-25 MED ORDER — AMOXICILLIN-POT CLAVULANATE 875-125 MG PO TABS: 1.0000 | ORAL_TABLET | Freq: Two times a day (BID) | ORAL | 0 refills | Status: DC

## 2017-03-25 NOTE — Discharge Instructions (Signed)
You refused Rabies vaccines. You refused a splint that was recommended by a hand surgeon. Please take antibiotics as prescribed to prevent infection. Follow up with your doctor or here for recheck tomorrow.

## 2017-03-25 NOTE — ED Provider Notes (Signed)
[facility] DEPT Provider Note   CSN: [id number] Arrival date & time: 03/24/17  2054     History   Chief Complaint Chief Complaint  Patient presents with  . Animal Bite    HPI Theresa [name] is a 83 y.o. female.  HPI  Theresa [name] is a 83 y.o. female presents to emergency department with complaint of cat bite. Patient states she was at a home store when she found a few small kittens. She states kittens are stray. She tried to pet one of the kittens that was eating and it bit her and scratched her on her left hand and right forearm. She denies any redness or drainage. She reports swelling. She denies any difficulty moving her hand or fingers. No treatment prior to coming in. Tetanus is unknown.   Past Medical History:  Diagnosis Date  . Arthritis    Degenerative  . Asthma   . Cervical spondylosis   . Cervicogenic headache   . Gastroesophageal reflux disease   . Headache   . History of renal calculi   . Memory difficulties 02/10/2016  . Mitral valve prolapse     Patient Active Problem List   Diagnosis Date Noted  . Memory difficulties 02/10/2016  . Mitral valve prolapse   . Asthma   . History of renal calculi   . Arthritis   . Cervical spondylosis without myelopathy 07/06/2012  . Headache 07/06/2012    Past Surgical History:  Procedure Laterality Date  . ABDOMINAL HYSTERECTOMY    . BUNIONECTOMY Right   . CATARACT EXTRACTION    . CERVICAL SPINE SURGERY     On 3 occasions  . CHOLECYSTECTOMY    . ELBOW SURGERY     For bone spur    OB History    No data available       Home Medications    Prior to Admission medications   Medication Sig Start Date End Date Taking? Authorizing Provider  diclofenac sodium (VOLTAREN) 1 % GEL Apply 2 g topically 2 (two) times daily as needed.    [provider]  ibuprofen (ADVIL,MOTRIN) 200 MG tablet Take 200 mg by mouth every 6 (six) hours as needed.    [provider]    Family History Family  History  Problem Relation Age of Onset  . Heart attack Father   . Heart disease Sister   . Cancer Sister   . Coronary artery disease Brother   . Diabetes Sister   . Alzheimer's disease Brother   . Diabetes Brother     Social History Social History  Substance Use Topics  . Smoking status: Never Smoker  . Smokeless tobacco: Never Used  . Alcohol use 0.0 oz/week     Comment: drinks alcohol on occasion     Allergies   Amitriptyline hcl; Clindamycin/lincomycin; Zithromax [azithromycin]; Capsaicin; and Ceclor [cefaclor]   Review of Systems Review of Systems  Constitutional: Negative for chills and fever.  Musculoskeletal: Positive for arthralgias and joint swelling.  Skin: Positive for wound.  Neurological: Negative for weakness and numbness.  All other systems reviewed and are negative.    Physical Exam Updated Vital Signs BP 139/72 (BP Location: Right Arm)   Pulse 66   Temp 97.8 F (36.6 C) (Oral)   Resp 18   Ht 4\' 11"  (1.499 m)   Wt 65.8 kg (145 lb)   BMI 29.29 kg/m   Physical Exam  Constitutional: She appears well-developed and well-nourished. No distress.  Eyes: Conjunctivae are normal.  Neck: Neck supple.  Musculoskeletal:  Several superficial abrasions to the right forearm and left hand. There are several puncture wounds to the left thenar eminence and base of the left thumb. There is mild swelling. No redness or drainage. No streaks. Full range of motion of the left thumb at each joint. Strength is intact with flexion and extension at all joints. Capillary refill less than 2 seconds distally. Sensation intact distally.  Neurological: She is alert.  Skin: Skin is warm and dry.  Nursing note and vitals reviewed.    ED Treatments / Results  Labs (all labs ordered are listed, but only abnormal results are displayed) Labs Reviewed - No data to display  EKG  EKG Interpretation None       Radiology Dg Finger Thumb Left  Result Date:  03/24/2017 CLINICAL DATA:  Cat bite EXAM: LEFT THUMB 2+V COMPARISON:  None. FINDINGS: No fracture or malalignment. Degenerative changes at the first [facility] joint and PIP joint. No radiopaque foreign body. IMPRESSION: Mild degenerative changes.  No acute osseous abnormality. Electronically Signed   By: Donavan Foil M.D.   On: 03/24/2017 23:54    Procedures Procedures (including critical care time)  Medications Ordered in ED Medications  Tdap (BOOSTRIX) injection 0.5 mL (0.5 mLs Intramuscular Given 03/24/17 2358)  amoxicillin-clavulanate (AUGMENTIN) 875-125 MG per tablet 1 tablet (1 tablet Oral Given 03/24/17 2358)     Initial Impression / Assessment and Plan / ED Course  I have reviewed the triage vital signs and the nursing notes.  Pertinent labs & imaging results that were available during my care of the patient were reviewed by me and considered in my medical decision making (see chart for details).     Patient in emergency department with cat bite to the left hand. Several puncture wounds noted. No signs of infection at this time. She is neurovascularly intact. Discussed with patient plan of an x-ray, updating tetanus, and rabies vaccines. Patient refused rabies. She understands that if she does gets rabies infection that it is lethal. She is agreeable to the x-ray and tetanus. I discussed with Dr. Gustavus Messing, who came by and has evaluated patient as well. She again refused rabies vaccine. We will get x-rays done, but tetanus, consult hand surgery. Will start Augmentin.  12:33 AM X-rays negative. Discussed with Dr. Apolonio Schneiders with hand surgery who recommended splinting her in a plaster splint that "she cannot take off." I asked if we can follow this patient up with him in the office, and he advised to have her follow-up with emergency department since we were to want to see her. I will have patient follow with her family doctor or here for recheck tomorrow. Patient again declined rabies vaccine and  immunoglobulin.  12:37 AM I went into the room to scrub patient's hand with a surgical scrub her, however patient refused. She also refused a splint. She understands that this is a specialist's recommendation and she does not want any of these treatments. I will discharge her home with antibiotics but let her go [place]. I explained to her that there is a high chance of this wound getting infected especially in the location that is an. The wound is over her joint and possibly tendons and muscle. She voiced understanding.  Vitals:   03/24/17 2131  BP: 139/72  Pulse: 66  Resp: 18  Temp: 97.8 F (36.6 C)  TempSrc: Oral  Weight: 65.8 kg (145 lb)  Height: 4\' 11"  (1.499 m)  Final Clinical Impressions(s) / ED Diagnoses   Final diagnoses:  Cat bite, initial encounter    New Prescriptions New Prescriptions   AMOXICILLIN-CLAVULANATE (AUGMENTIN) 875-125 MG TABLET    Take 1 tablet by mouth every 12 (twelve) hours.     Jeannett Senior, PA-C 03/25/17 0038    Tegeler, Gwenyth Allegra, MD 03/27/17 [phone]

## 2017-03-25 NOTE — ED Notes (Signed)
Pt is refusing a wrist splint, refused scrubbing of the area and also refuses the rabies vaccination. Pt just stated she was going to got to her family DR. Tomorrow and wanted to leave

## 2017-04-07 ENCOUNTER — Encounter: Payer: Self-pay | Admitting: Podiatry

## 2017-04-07 ENCOUNTER — Ambulatory Visit (INDEPENDENT_AMBULATORY_CARE_PROVIDER_SITE_OTHER): Payer: Medicare Other | Admitting: Podiatry

## 2017-04-07 VITALS — BP 111/56 | HR 73 | Resp 18

## 2017-04-07 DIAGNOSIS — R609 Edema, unspecified: Secondary | ICD-10-CM

## 2017-04-07 NOTE — Progress Notes (Signed)
   Subjective:    Patient ID: Theresa [name], female    DOB: 1933-06-28, 83 y.o.   MRN: [id number]  HPI   Chief Complaint  Patient presents with  . Foot Problem    i have had surgery on this foot and hurts and the doctor that did it-did not do it right    33 -year-old female reports right foot and ankle swelling. Denies diabetes; reports aching shooting tingling pain to her right foot. Has history surgery to the second and third toes of her right foot. Declined x-rays today.    Review of Systems  All other systems reviewed and are negative.      Objective:   Physical Exam Vitals:   04/07/17 1342  BP: (!) 111/56  Pulse: 73  Resp: 18   General AA&O x3. Normal mood and affect.  Vascular Dorsalis pedis and posterior tibial pulses  present 2+ bilat  Capillary refill normal to all digits. Pedal hair growth normal. Slight pitting edema bilateral lower extremities   Neurologic Epicritic sensation grossly present. Decreased sensation right second third toes   Dermatologic No open lesions. Interspaces clear of maceration. Nails well groomed and normal in appearance. Healed incisions right second and third toes   Orthopedic: MMT 5/5 in dorsiflexion, plantarflexion, inversion, and eversion. Normal joint ROM without pain or crepitus.     Assessment & Plan:  Edema bilateral lower extremities; more likely related to venous insufficiency rather than surgery -Educated on etiology -Discussed over-the-counter compression socks. -Follow-up when PRN

## 2017-05-25 ENCOUNTER — Other Ambulatory Visit: Payer: Self-pay | Admitting: Family Medicine

## 2017-05-25 ENCOUNTER — Ambulatory Visit
Admission: RE | Admit: 2017-05-25 | Discharge: 2017-05-25 | Disposition: A | Discharge status: Home / Self Care | Payer: Medicare Other | Source: Ambulatory Visit | Attending: Family Medicine | Admitting: Family Medicine

## 2017-05-25 DIAGNOSIS — R52 Pain, unspecified: Secondary | ICD-10-CM

## 2017-10-12 ENCOUNTER — Ambulatory Visit
Admission: RE | Admit: 2017-10-12 | Discharge: 2017-10-12 | Disposition: A | Discharge status: Home / Self Care | Payer: Medicare Other | Source: Ambulatory Visit | Attending: Family Medicine | Admitting: Family Medicine

## 2017-10-12 ENCOUNTER — Other Ambulatory Visit: Payer: Medicare Other

## 2017-10-12 ENCOUNTER — Other Ambulatory Visit: Payer: Self-pay | Admitting: Family Medicine

## 2017-10-12 DIAGNOSIS — S0990XA Unspecified injury of head, initial encounter: Secondary | ICD-10-CM

## 2017-10-12 DIAGNOSIS — R52 Pain, unspecified: Secondary | ICD-10-CM

## 2018-05-05 ENCOUNTER — Ambulatory Visit
Admission: RE | Admit: 2018-05-05 | Discharge: 2018-05-05 | Disposition: A | Discharge status: Home / Self Care | Payer: Medicare Other | Source: Ambulatory Visit | Attending: Family Medicine | Admitting: Family Medicine

## 2018-05-05 ENCOUNTER — Other Ambulatory Visit: Payer: Self-pay | Admitting: Family Medicine

## 2018-05-05 DIAGNOSIS — R1032 Left lower quadrant pain: Secondary | ICD-10-CM

## 2018-05-05 DIAGNOSIS — R079 Chest pain, unspecified: Secondary | ICD-10-CM

## 2018-05-05 DIAGNOSIS — Z1231 Encounter for screening mammogram for malignant neoplasm of breast: Secondary | ICD-10-CM

## 2018-05-05 MED ORDER — IOPAMIDOL (ISOVUE-300) INJECTION 61%
100.0000 mL | Freq: Once | INTRAVENOUS | Status: AC | PRN
  Administered 2018-05-05: 100 mL via INTRAVENOUS

## 2018-05-12 ENCOUNTER — Ambulatory Visit
Admission: RE | Admit: 2018-05-12 | Discharge: 2018-05-12 | Disposition: A | Discharge status: Home / Self Care | Payer: Medicare Other | Source: Ambulatory Visit | Attending: Family Medicine | Admitting: Family Medicine

## 2018-05-12 DIAGNOSIS — Z1231 Encounter for screening mammogram for malignant neoplasm of breast: Secondary | ICD-10-CM

## 2018-07-20 DIAGNOSIS — U071 COVID-19: Secondary | ICD-10-CM

## 2018-07-20 HISTORY — DX: COVID-19: U07.1

## 2018-07-29 DIAGNOSIS — M810 Age-related osteoporosis without current pathological fracture: Secondary | ICD-10-CM | POA: Diagnosis not present

## 2018-08-17 DIAGNOSIS — T2662XA Corrosion of cornea and conjunctival sac, left eye, initial encounter: Secondary | ICD-10-CM | POA: Diagnosis not present

## 2018-08-23 ENCOUNTER — Other Ambulatory Visit: Payer: Self-pay | Admitting: Gastroenterology

## 2018-08-23 DIAGNOSIS — R1011 Right upper quadrant pain: Secondary | ICD-10-CM

## 2018-08-23 DIAGNOSIS — R1013 Epigastric pain: Secondary | ICD-10-CM | POA: Diagnosis not present

## 2018-08-23 DIAGNOSIS — R1012 Left upper quadrant pain: Secondary | ICD-10-CM | POA: Diagnosis not present

## 2018-08-23 DIAGNOSIS — Z8719 Personal history of other diseases of the digestive system: Secondary | ICD-10-CM | POA: Diagnosis not present

## 2018-08-23 DIAGNOSIS — Z9049 Acquired absence of other specified parts of digestive tract: Secondary | ICD-10-CM | POA: Diagnosis not present

## 2018-08-23 DIAGNOSIS — Z8673 Personal history of transient ischemic attack (TIA), and cerebral infarction without residual deficits: Secondary | ICD-10-CM | POA: Diagnosis not present

## 2018-08-29 DIAGNOSIS — H029 Unspecified disorder of eyelid: Secondary | ICD-10-CM | POA: Diagnosis not present

## 2018-08-29 DIAGNOSIS — H02831 Dermatochalasis of right upper eyelid: Secondary | ICD-10-CM | POA: Diagnosis not present

## 2018-08-29 DIAGNOSIS — L820 Inflamed seborrheic keratosis: Secondary | ICD-10-CM | POA: Diagnosis not present

## 2018-09-02 DIAGNOSIS — R1013 Epigastric pain: Secondary | ICD-10-CM | POA: Diagnosis not present

## 2018-09-02 DIAGNOSIS — K449 Diaphragmatic hernia without obstruction or gangrene: Secondary | ICD-10-CM | POA: Diagnosis not present

## 2018-09-05 ENCOUNTER — Ambulatory Visit
Admission: RE | Admit: 2018-09-05 | Discharge: 2018-09-05 | Disposition: A | Discharge status: Home / Self Care | Payer: Medicare Other | Source: Ambulatory Visit | Attending: Gastroenterology | Admitting: Gastroenterology

## 2018-09-05 DIAGNOSIS — R1013 Epigastric pain: Secondary | ICD-10-CM

## 2018-09-05 DIAGNOSIS — R1012 Left upper quadrant pain: Secondary | ICD-10-CM

## 2018-09-05 DIAGNOSIS — R1011 Right upper quadrant pain: Secondary | ICD-10-CM

## 2018-09-05 DIAGNOSIS — K5730 Diverticulosis of large intestine without perforation or abscess without bleeding: Secondary | ICD-10-CM | POA: Diagnosis not present

## 2018-09-05 MED ORDER — IOPAMIDOL (ISOVUE-300) INJECTION 61%
100.0000 mL | Freq: Once | INTRAVENOUS | Status: AC | PRN
  Administered 2018-09-05: 100 mL via INTRAVENOUS

## 2018-09-09 DIAGNOSIS — M1712 Unilateral primary osteoarthritis, left knee: Secondary | ICD-10-CM | POA: Diagnosis not present

## 2018-09-09 DIAGNOSIS — M545 Low back pain: Secondary | ICD-10-CM | POA: Diagnosis not present

## 2018-09-09 DIAGNOSIS — M25552 Pain in left hip: Secondary | ICD-10-CM | POA: Diagnosis not present

## 2018-09-13 ENCOUNTER — Other Ambulatory Visit: Payer: Self-pay | Admitting: Dermatology

## 2018-09-13 DIAGNOSIS — L570 Actinic keratosis: Secondary | ICD-10-CM | POA: Diagnosis not present

## 2018-09-13 DIAGNOSIS — M9903 Segmental and somatic dysfunction of lumbar region: Secondary | ICD-10-CM | POA: Diagnosis not present

## 2018-09-13 DIAGNOSIS — M9902 Segmental and somatic dysfunction of thoracic region: Secondary | ICD-10-CM | POA: Diagnosis not present

## 2018-09-13 DIAGNOSIS — C44329 Squamous cell carcinoma of skin of other parts of face: Secondary | ICD-10-CM | POA: Diagnosis not present

## 2018-09-13 DIAGNOSIS — M5414 Radiculopathy, thoracic region: Secondary | ICD-10-CM | POA: Diagnosis not present

## 2018-09-13 DIAGNOSIS — M5136 Other intervertebral disc degeneration, lumbar region: Secondary | ICD-10-CM | POA: Diagnosis not present

## 2018-09-13 DIAGNOSIS — M9905 Segmental and somatic dysfunction of pelvic region: Secondary | ICD-10-CM | POA: Diagnosis not present

## 2018-09-13 DIAGNOSIS — M5417 Radiculopathy, lumbosacral region: Secondary | ICD-10-CM | POA: Diagnosis not present

## 2018-09-15 DIAGNOSIS — M5136 Other intervertebral disc degeneration, lumbar region: Secondary | ICD-10-CM | POA: Diagnosis not present

## 2018-09-15 DIAGNOSIS — M9903 Segmental and somatic dysfunction of lumbar region: Secondary | ICD-10-CM | POA: Diagnosis not present

## 2018-09-15 DIAGNOSIS — M9902 Segmental and somatic dysfunction of thoracic region: Secondary | ICD-10-CM | POA: Diagnosis not present

## 2018-09-15 DIAGNOSIS — M5414 Radiculopathy, thoracic region: Secondary | ICD-10-CM | POA: Diagnosis not present

## 2018-09-15 DIAGNOSIS — M5417 Radiculopathy, lumbosacral region: Secondary | ICD-10-CM | POA: Diagnosis not present

## 2018-09-15 DIAGNOSIS — M9905 Segmental and somatic dysfunction of pelvic region: Secondary | ICD-10-CM | POA: Diagnosis not present

## 2018-09-16 DIAGNOSIS — M545 Low back pain: Secondary | ICD-10-CM | POA: Diagnosis not present

## 2018-09-19 DIAGNOSIS — K219 Gastro-esophageal reflux disease without esophagitis: Secondary | ICD-10-CM | POA: Diagnosis not present

## 2018-09-19 DIAGNOSIS — M9905 Segmental and somatic dysfunction of pelvic region: Secondary | ICD-10-CM | POA: Diagnosis not present

## 2018-09-19 DIAGNOSIS — I341 Nonrheumatic mitral (valve) prolapse: Secondary | ICD-10-CM | POA: Diagnosis not present

## 2018-09-19 DIAGNOSIS — Z8673 Personal history of transient ischemic attack (TIA), and cerebral infarction without residual deficits: Secondary | ICD-10-CM | POA: Diagnosis not present

## 2018-09-19 DIAGNOSIS — R1084 Generalized abdominal pain: Secondary | ICD-10-CM | POA: Diagnosis not present

## 2018-09-19 DIAGNOSIS — M9903 Segmental and somatic dysfunction of lumbar region: Secondary | ICD-10-CM | POA: Diagnosis not present

## 2018-09-19 DIAGNOSIS — M5417 Radiculopathy, lumbosacral region: Secondary | ICD-10-CM | POA: Diagnosis not present

## 2018-09-19 DIAGNOSIS — M810 Age-related osteoporosis without current pathological fracture: Secondary | ICD-10-CM | POA: Diagnosis not present

## 2018-09-19 DIAGNOSIS — M5136 Other intervertebral disc degeneration, lumbar region: Secondary | ICD-10-CM | POA: Diagnosis not present

## 2018-09-19 DIAGNOSIS — M5414 Radiculopathy, thoracic region: Secondary | ICD-10-CM | POA: Diagnosis not present

## 2018-09-19 DIAGNOSIS — M9902 Segmental and somatic dysfunction of thoracic region: Secondary | ICD-10-CM | POA: Diagnosis not present

## 2018-09-21 DIAGNOSIS — M5136 Other intervertebral disc degeneration, lumbar region: Secondary | ICD-10-CM | POA: Diagnosis not present

## 2018-09-21 DIAGNOSIS — M5417 Radiculopathy, lumbosacral region: Secondary | ICD-10-CM | POA: Diagnosis not present

## 2018-09-21 DIAGNOSIS — M9905 Segmental and somatic dysfunction of pelvic region: Secondary | ICD-10-CM | POA: Diagnosis not present

## 2018-09-21 DIAGNOSIS — M9903 Segmental and somatic dysfunction of lumbar region: Secondary | ICD-10-CM | POA: Diagnosis not present

## 2018-09-21 DIAGNOSIS — M9902 Segmental and somatic dysfunction of thoracic region: Secondary | ICD-10-CM | POA: Diagnosis not present

## 2018-09-21 DIAGNOSIS — M5414 Radiculopathy, thoracic region: Secondary | ICD-10-CM | POA: Diagnosis not present

## 2018-09-23 DIAGNOSIS — M545 Low back pain: Secondary | ICD-10-CM | POA: Diagnosis not present

## 2018-09-23 DIAGNOSIS — M48061 Spinal stenosis, lumbar region without neurogenic claudication: Secondary | ICD-10-CM | POA: Diagnosis not present

## 2018-09-23 DIAGNOSIS — M1712 Unilateral primary osteoarthritis, left knee: Secondary | ICD-10-CM | POA: Diagnosis not present

## 2018-09-26 DIAGNOSIS — M9905 Segmental and somatic dysfunction of pelvic region: Secondary | ICD-10-CM | POA: Diagnosis not present

## 2018-09-26 DIAGNOSIS — M9902 Segmental and somatic dysfunction of thoracic region: Secondary | ICD-10-CM | POA: Diagnosis not present

## 2018-09-26 DIAGNOSIS — M5414 Radiculopathy, thoracic region: Secondary | ICD-10-CM | POA: Diagnosis not present

## 2018-09-26 DIAGNOSIS — M5136 Other intervertebral disc degeneration, lumbar region: Secondary | ICD-10-CM | POA: Diagnosis not present

## 2018-09-26 DIAGNOSIS — M5417 Radiculopathy, lumbosacral region: Secondary | ICD-10-CM | POA: Diagnosis not present

## 2018-09-26 DIAGNOSIS — M9903 Segmental and somatic dysfunction of lumbar region: Secondary | ICD-10-CM | POA: Diagnosis not present

## 2018-09-29 DIAGNOSIS — M9902 Segmental and somatic dysfunction of thoracic region: Secondary | ICD-10-CM | POA: Diagnosis not present

## 2018-09-29 DIAGNOSIS — M5136 Other intervertebral disc degeneration, lumbar region: Secondary | ICD-10-CM | POA: Diagnosis not present

## 2018-09-29 DIAGNOSIS — M5414 Radiculopathy, thoracic region: Secondary | ICD-10-CM | POA: Diagnosis not present

## 2018-09-29 DIAGNOSIS — M9903 Segmental and somatic dysfunction of lumbar region: Secondary | ICD-10-CM | POA: Diagnosis not present

## 2018-09-29 DIAGNOSIS — M5417 Radiculopathy, lumbosacral region: Secondary | ICD-10-CM | POA: Diagnosis not present

## 2018-09-29 DIAGNOSIS — M9905 Segmental and somatic dysfunction of pelvic region: Secondary | ICD-10-CM | POA: Diagnosis not present

## 2018-10-03 DIAGNOSIS — M5136 Other intervertebral disc degeneration, lumbar region: Secondary | ICD-10-CM | POA: Diagnosis not present

## 2018-10-03 DIAGNOSIS — M5414 Radiculopathy, thoracic region: Secondary | ICD-10-CM | POA: Diagnosis not present

## 2018-10-03 DIAGNOSIS — M5417 Radiculopathy, lumbosacral region: Secondary | ICD-10-CM | POA: Diagnosis not present

## 2018-10-03 DIAGNOSIS — M9905 Segmental and somatic dysfunction of pelvic region: Secondary | ICD-10-CM | POA: Diagnosis not present

## 2018-10-03 DIAGNOSIS — M9902 Segmental and somatic dysfunction of thoracic region: Secondary | ICD-10-CM | POA: Diagnosis not present

## 2018-10-03 DIAGNOSIS — M9903 Segmental and somatic dysfunction of lumbar region: Secondary | ICD-10-CM | POA: Diagnosis not present

## 2018-10-12 DIAGNOSIS — M48061 Spinal stenosis, lumbar region without neurogenic claudication: Secondary | ICD-10-CM | POA: Diagnosis not present

## 2018-10-12 DIAGNOSIS — M5136 Other intervertebral disc degeneration, lumbar region: Secondary | ICD-10-CM | POA: Diagnosis not present

## 2018-10-18 DIAGNOSIS — S2001XA Contusion of right breast, initial encounter: Secondary | ICD-10-CM | POA: Diagnosis not present

## 2018-10-27 DIAGNOSIS — C44329 Squamous cell carcinoma of skin of other parts of face: Secondary | ICD-10-CM | POA: Diagnosis not present

## 2018-11-23 DIAGNOSIS — M549 Dorsalgia, unspecified: Secondary | ICD-10-CM | POA: Diagnosis not present

## 2018-11-23 DIAGNOSIS — M4716 Other spondylosis with myelopathy, lumbar region: Secondary | ICD-10-CM | POA: Diagnosis not present

## 2018-11-23 DIAGNOSIS — M4316 Spondylolisthesis, lumbar region: Secondary | ICD-10-CM | POA: Diagnosis not present

## 2018-11-23 DIAGNOSIS — M5136 Other intervertebral disc degeneration, lumbar region: Secondary | ICD-10-CM | POA: Diagnosis not present

## 2018-11-23 DIAGNOSIS — M48062 Spinal stenosis, lumbar region with neurogenic claudication: Secondary | ICD-10-CM | POA: Diagnosis not present

## 2018-11-24 DIAGNOSIS — Z01818 Encounter for other preprocedural examination: Secondary | ICD-10-CM | POA: Diagnosis not present

## 2018-11-24 DIAGNOSIS — M5136 Other intervertebral disc degeneration, lumbar region: Secondary | ICD-10-CM | POA: Diagnosis not present

## 2018-12-23 DIAGNOSIS — Z4689 Encounter for fitting and adjustment of other specified devices: Secondary | ICD-10-CM | POA: Diagnosis not present

## 2018-12-23 DIAGNOSIS — M4316 Spondylolisthesis, lumbar region: Secondary | ICD-10-CM | POA: Diagnosis not present

## 2018-12-23 DIAGNOSIS — Z01812 Encounter for preprocedural laboratory examination: Secondary | ICD-10-CM | POA: Diagnosis not present

## 2018-12-23 DIAGNOSIS — M5136 Other intervertebral disc degeneration, lumbar region: Secondary | ICD-10-CM | POA: Diagnosis not present

## 2018-12-23 DIAGNOSIS — M48062 Spinal stenosis, lumbar region with neurogenic claudication: Secondary | ICD-10-CM | POA: Diagnosis not present

## 2018-12-23 DIAGNOSIS — Z1159 Encounter for screening for other viral diseases: Secondary | ICD-10-CM | POA: Diagnosis not present

## 2019-01-03 DIAGNOSIS — Z1159 Encounter for screening for other viral diseases: Secondary | ICD-10-CM | POA: Diagnosis not present

## 2019-01-03 DIAGNOSIS — Z01812 Encounter for preprocedural laboratory examination: Secondary | ICD-10-CM | POA: Diagnosis not present

## 2019-01-03 DIAGNOSIS — M4316 Spondylolisthesis, lumbar region: Secondary | ICD-10-CM | POA: Diagnosis not present

## 2019-01-03 DIAGNOSIS — M48061 Spinal stenosis, lumbar region without neurogenic claudication: Secondary | ICD-10-CM | POA: Diagnosis not present

## 2019-01-10 DIAGNOSIS — M5416 Radiculopathy, lumbar region: Secondary | ICD-10-CM | POA: Diagnosis not present

## 2019-01-10 DIAGNOSIS — M47896 Other spondylosis, lumbar region: Secondary | ICD-10-CM | POA: Diagnosis not present

## 2019-01-10 DIAGNOSIS — Z8673 Personal history of transient ischemic attack (TIA), and cerebral infarction without residual deficits: Secondary | ICD-10-CM | POA: Diagnosis not present

## 2019-01-10 DIAGNOSIS — M5136 Other intervertebral disc degeneration, lumbar region: Secondary | ICD-10-CM | POA: Diagnosis not present

## 2019-01-10 DIAGNOSIS — Z01810 Encounter for preprocedural cardiovascular examination: Secondary | ICD-10-CM | POA: Diagnosis not present

## 2019-01-10 DIAGNOSIS — M4716 Other spondylosis with myelopathy, lumbar region: Secondary | ICD-10-CM | POA: Diagnosis not present

## 2019-01-10 DIAGNOSIS — Z981 Arthrodesis status: Secondary | ICD-10-CM | POA: Diagnosis not present

## 2019-01-10 DIAGNOSIS — K219 Gastro-esophageal reflux disease without esophagitis: Secondary | ICD-10-CM | POA: Diagnosis not present

## 2019-01-10 DIAGNOSIS — M5432 Sciatica, left side: Secondary | ICD-10-CM | POA: Diagnosis not present

## 2019-01-10 DIAGNOSIS — M4326 Fusion of spine, lumbar region: Secondary | ICD-10-CM | POA: Diagnosis not present

## 2019-01-10 DIAGNOSIS — M4316 Spondylolisthesis, lumbar region: Secondary | ICD-10-CM | POA: Diagnosis not present

## 2019-01-10 DIAGNOSIS — M48062 Spinal stenosis, lumbar region with neurogenic claudication: Secondary | ICD-10-CM | POA: Diagnosis not present

## 2019-01-10 DIAGNOSIS — R2681 Unsteadiness on feet: Secondary | ICD-10-CM | POA: Diagnosis not present

## 2019-01-19 DIAGNOSIS — Z6829 Body mass index (BMI) 29.0-29.9, adult: Secondary | ICD-10-CM | POA: Diagnosis not present

## 2019-01-19 DIAGNOSIS — M5416 Radiculopathy, lumbar region: Secondary | ICD-10-CM | POA: Diagnosis not present

## 2019-01-19 DIAGNOSIS — M4326 Fusion of spine, lumbar region: Secondary | ICD-10-CM | POA: Diagnosis not present

## 2019-01-19 DIAGNOSIS — M48062 Spinal stenosis, lumbar region with neurogenic claudication: Secondary | ICD-10-CM | POA: Diagnosis not present

## 2019-01-31 DIAGNOSIS — M810 Age-related osteoporosis without current pathological fracture: Secondary | ICD-10-CM | POA: Diagnosis not present

## 2019-02-16 DIAGNOSIS — M4326 Fusion of spine, lumbar region: Secondary | ICD-10-CM | POA: Diagnosis not present

## 2019-03-28 DIAGNOSIS — M4326 Fusion of spine, lumbar region: Secondary | ICD-10-CM | POA: Diagnosis not present

## 2019-04-04 DIAGNOSIS — D179 Benign lipomatous neoplasm, unspecified: Secondary | ICD-10-CM | POA: Diagnosis not present

## 2019-04-04 DIAGNOSIS — L821 Other seborrheic keratosis: Secondary | ICD-10-CM | POA: Diagnosis not present

## 2019-04-04 DIAGNOSIS — D229 Melanocytic nevi, unspecified: Secondary | ICD-10-CM | POA: Diagnosis not present

## 2019-04-22 DIAGNOSIS — Z20828 Contact with and (suspected) exposure to other viral communicable diseases: Secondary | ICD-10-CM | POA: Diagnosis not present

## 2019-04-22 DIAGNOSIS — R05 Cough: Secondary | ICD-10-CM | POA: Diagnosis not present

## 2019-05-03 DIAGNOSIS — M1712 Unilateral primary osteoarthritis, left knee: Secondary | ICD-10-CM | POA: Diagnosis not present

## 2019-05-03 DIAGNOSIS — Z6826 Body mass index (BMI) 26.0-26.9, adult: Secondary | ICD-10-CM | POA: Diagnosis not present

## 2019-05-03 DIAGNOSIS — M25562 Pain in left knee: Secondary | ICD-10-CM | POA: Diagnosis not present

## 2019-05-03 DIAGNOSIS — M4326 Fusion of spine, lumbar region: Secondary | ICD-10-CM | POA: Diagnosis not present

## 2019-05-04 ENCOUNTER — Ambulatory Visit: Payer: Medicare Other | Admitting: Neurology

## 2019-05-04 ENCOUNTER — Encounter: Payer: Self-pay | Admitting: Neurology

## 2019-05-05 ENCOUNTER — Ambulatory Visit (HOSPITAL_COMMUNITY)
Admission: RE | Admit: 2019-05-05 | Discharge: 2019-05-05 | Disposition: A | Discharge status: Home / Self Care | Payer: Medicare HMO | Source: Ambulatory Visit | Attending: Physician Assistant | Admitting: Physician Assistant

## 2019-05-05 ENCOUNTER — Other Ambulatory Visit (HOSPITAL_COMMUNITY): Payer: Self-pay | Admitting: Physician Assistant

## 2019-05-05 ENCOUNTER — Other Ambulatory Visit: Payer: Self-pay

## 2019-05-05 DIAGNOSIS — S0081XA Abrasion of other part of head, initial encounter: Secondary | ICD-10-CM | POA: Diagnosis not present

## 2019-05-05 DIAGNOSIS — W19XXXA Unspecified fall, initial encounter: Secondary | ICD-10-CM

## 2019-05-05 DIAGNOSIS — G44319 Acute post-traumatic headache, not intractable: Secondary | ICD-10-CM | POA: Diagnosis not present

## 2019-05-05 DIAGNOSIS — R519 Headache, unspecified: Secondary | ICD-10-CM

## 2019-05-05 DIAGNOSIS — R296 Repeated falls: Secondary | ICD-10-CM | POA: Diagnosis not present

## 2019-05-05 DIAGNOSIS — S0990XA Unspecified injury of head, initial encounter: Secondary | ICD-10-CM | POA: Diagnosis not present

## 2019-05-08 ENCOUNTER — Ambulatory Visit (HOSPITAL_COMMUNITY): Payer: Medicare HMO

## 2019-05-16 ENCOUNTER — Encounter: Payer: Self-pay | Admitting: Neurology

## 2019-05-30 ENCOUNTER — Ambulatory Visit: Payer: Self-pay | Admitting: Neurology

## 2019-06-12 NOTE — Progress Notes (Signed)
NEUROLOGY CONSULTATION NOTE  Theresa [name] MRN: [id number] DOB: 1933-06-20  Referring provider: Benjiman Core, PA-C Primary care provider: Harlan Stains, MD  Reason for consult:  Unsteady gait/leg weakness  HISTORY OF PRESENT ILLNESS: Theresa [name] is an 85 year old right-handed female with cervical spondylosis, cervicogenic headache, arthritis, MVP and asthma who presents for leg weakness/falls and headache.  History supplemented by prior neurology, surgery and referring provider notes.  She reports multiple falls over the past several years.  In late September-early October, she tripped and fell on her patio, hitting the left side of her forehead on the concrete.  A few days later, she tripped again over a tree branch and striking the right side of her head on the ground.  She did not lose consciousness with either of these falls.  CT head without contrast from 05/05/2019 was personally reviewed and demonstrated no acute intracranial abnormality.  She reports some lightheadedness since the fall.  When she gets up in the morning, she has to sit on side of bed until she can focus.  It may occur spontaneously as well.  She still has some pain on the left side of her head down the left posterior side of her neck. She does have a history of left sided occipital/cervicogenic pressure and paroxysmal headache.  She also has history of cervical spondylosis with prior cervical fusion.  She had previously had cervicogenic headaches several years ago, which subsequently resolved.   She has known lumbar spinal stenosis.  She underwent L3-4 and L4-L5 laminectomy with L4-L5 fusion on 01/10/2019.  She reports residual left leg weakness and left knee pain.  At times, she still reports pain when she walks.  She reports numbness in the feet from toes up to ankles.  She reports arthritis.   MRI brain without contrast from 02/19/2016 personally reviewed showed chronic small vessel ischemic changes in the  periventricular and subcortical cerebral white matter, as well as lacunar infarct in right cerebellar hemisphere, stable compared to prior MRI from 07/15/2012.    PAST MEDICAL HISTORY: Past Medical History:  Diagnosis Date  . Arthritis    Degenerative  . Asthma   . Cervical spondylosis   . Cervicogenic headache   . Gastroesophageal reflux disease   . Headache   . History of renal calculi   . Memory difficulties 02/10/2016  . Mitral valve prolapse     PAST SURGICAL HISTORY: Past Surgical History:  Procedure Laterality Date  . ABDOMINAL HYSTERECTOMY    . BUNIONECTOMY Right   . CATARACT EXTRACTION    . CERVICAL SPINE SURGERY     On 3 occasions  . CHOLECYSTECTOMY    . ELBOW SURGERY     For bone spur    MEDICATIONS: Current Outpatient Medications on File Prior to Visit  Medication Sig Dispense Refill  . amoxicillin-clavulanate (AUGMENTIN) 875-125 MG tablet Take 1 tablet by mouth every 12 (twelve) hours. 14 tablet 0  . diclofenac sodium (VOLTAREN) 1 % GEL Apply 2 g topically 2 (two) times daily as needed.    Marland Kitchen ibuprofen (ADVIL,MOTRIN) 200 MG tablet Take 200 mg by mouth every 6 (six) hours as needed.     No current facility-administered medications on file prior to visit.     ALLERGIES: Allergies  Allergen Reactions  . Amitriptyline Hcl     Elevation in muscle enzymes  . Clindamycin/Lincomycin     Causes esophagitis  . Zithromax [Azithromycin] Rash  . Capsaicin   . Ceclor [Cefaclor] Nausea Only  FAMILY HISTORY: Family History  Problem Relation Age of Onset  . Heart attack Father   . Heart disease Sister   . Cancer Sister   . Coronary artery disease Brother   . Diabetes Sister   . Alzheimer's disease Brother   . Diabetes Brother    SOCIAL HISTORY: Social History   Socioeconomic History  . Marital status: Single    Spouse name: Not on file  . Number of children: Not on file  . Years of education: Some col  . Highest education level: Not on file   Occupational History  . Occupation: Retired  Scientific laboratory technician  . Financial resource strain: Not on file  . Food insecurity    Worry: Not on file    Inability: Not on file  . Transportation needs    Medical: Not on file    Non-medical: Not on file  Tobacco Use  . Smoking status: Never Smoker  . Smokeless tobacco: Never Used  Substance and Sexual Activity  . Alcohol use: Yes    Comment: drinks alcohol on occasion  . Drug use: Not on file  . Sexual activity: Not on file  Lifestyle  . Physical activity    Days per week: Not on file    Minutes per session: Not on file  . Stress: Not on file  Relationships  . Social Herbalist on phone: Not on file    Gets together: Not on file    Attends religious service: Not on file    Active member of club or organization: Not on file    Attends meetings of clubs or organizations: Not on file    Relationship status: Not on file  . Intimate partner violence    Fear of current or ex partner: Not on file    Emotionally abused: Not on file    Physically abused: Not on file    Forced sexual activity: Not on file  Other Topics Concern  . Not on file  Social History Narrative   Lives at home alone.   Right-handed.   2-3 cups caffeine per day.    REVIEW OF SYSTEMS: Constitutional: No fevers, chills, or sweats, no generalized fatigue, change in appetite Eyes: No visual changes, double vision, eye pain Ear, nose and throat: No hearing loss, ear pain, nasal congestion, sore throat Cardiovascular: No chest pain, palpitations Respiratory:  No shortness of breath at rest or with exertion, wheezes GastrointestinaI: No nausea, vomiting, diarrhea, abdominal pain, fecal incontinence Genitourinary:  No dysuria, urinary retention or frequency Musculoskeletal:  Neck pain, left knee pain Integumentary: No rash, pruritus, skin lesions Neurological: as above Psychiatric: No depression, insomnia, anxiety Endocrine: No palpitations, fatigue,  diaphoresis, mood swings, change in appetite, change in weight, increased thirst Hematologic/Lymphatic:  No purpura, petechiae. Allergic/Immunologic: no itchy/runny eyes, nasal congestion, recent allergic reactions, rashes  PHYSICAL EXAM: Blood pressure 127/73, pulse 71, height 4\' 11"  (1.499 m), weight 140 lb 3.2 oz (63.6 kg), SpO2 97 %. General: No acute distress.  Patient appears well-groomed.   Head:  Normocephalic/atraumatic Eyes:  fundi examined but not visualized Neck: supple, left sided suboccipital and paraspinal tenderness, full range of motion Back: No paraspinal tenderness Heart: regular rate and rhythm Lungs: Clear to auscultation bilaterally. Vascular: No carotid bruits. Neurological Exam: Mental status: alert and oriented to person, place, and time, recent and remote memory intact, fund of knowledge intact, attention and concentration intact, speech fluent and not dysarthric, language intact. Cranial nerves: CN I: not tested CN  II: pupils equal, round and reactive to light, visual fields intact CN III, IV, VI:  full range of motion, no nystagmus, no ptosis CN V: facial sensation intact CN VII: upper and lower face symmetric CN VIII: hearing intact CN IX, X: gag intact, uvula midline CN XI: sternocleidomastoid and trapezius muscles intact CN XII: tongue midline Bulk & Tone: normal, no fasciculations. Motor:  5/5 throughout Sensation: temperature sensation reduced in feet up to ankles; and vibration sensation reduced in toes. Deep Tendon Reflexes:  absent throughout, toes downgoing Finger to nose testing:  Without dysmetria.  Heel to shin:  Without dysmetria.  Gait:  Normal stance.  Mildly unsteady.  Able to turn but some unsteadiness with tandem walk. Romberg negative.  IMPRESSION: 1.  Unsteady gait:  Chronic.  May be related to neuropathy, arthritis or residual from lumbar spinal stenosis.  Unlikely will be anything correctable and is managed with supportive  care/safety measures. 2.  Headache/cervicalgia.  History of cervicogenic headache with cervical spondylosis s/p 3 surgeries, now aggravated by recent fall 3.  Dizziness, likely postconcussion or cervicogenic  PLAN: 1.  She was advised to use her cane or walker at all times 2.  She is prescribed baclofen for back pain, which she takes at bedtime.  She is able to take up to three times daily.  She will try that, but she was also cautioned about dizziness/drowsiness, so she shouldn't take it unless she knows she doesn't have to be up and ambulating for a while.   3.  Check B12 and TSH regarding neuropathy. 4.  If symptoms do not improve, she will follow up with me.  Thank you for allowing me to take part in the care of this patient.  Metta Clines, DO  CC:  Harlan Stains, MD  Benjiman Core, PA-C

## 2019-06-13 ENCOUNTER — Other Ambulatory Visit (INDEPENDENT_AMBULATORY_CARE_PROVIDER_SITE_OTHER): Payer: Medicare HMO

## 2019-06-13 ENCOUNTER — Ambulatory Visit: Payer: Medicare HMO | Admitting: Neurology

## 2019-06-13 ENCOUNTER — Other Ambulatory Visit: Payer: Self-pay

## 2019-06-13 ENCOUNTER — Encounter: Payer: Self-pay | Admitting: Neurology

## 2019-06-13 VITALS — BP 127/73 | HR 71 | Ht 59.0 in | Wt 140.2 lb

## 2019-06-13 DIAGNOSIS — M47812 Spondylosis without myelopathy or radiculopathy, cervical region: Secondary | ICD-10-CM | POA: Diagnosis not present

## 2019-06-13 DIAGNOSIS — G6289 Other specified polyneuropathies: Secondary | ICD-10-CM

## 2019-06-13 DIAGNOSIS — F0781 Postconcussional syndrome: Secondary | ICD-10-CM | POA: Diagnosis not present

## 2019-06-13 DIAGNOSIS — M542 Cervicalgia: Secondary | ICD-10-CM

## 2019-06-13 DIAGNOSIS — M48061 Spinal stenosis, lumbar region without neurogenic claudication: Secondary | ICD-10-CM | POA: Diagnosis not present

## 2019-06-13 DIAGNOSIS — R5383 Other fatigue: Secondary | ICD-10-CM | POA: Diagnosis not present

## 2019-06-13 DIAGNOSIS — R2681 Unsteadiness on feet: Secondary | ICD-10-CM | POA: Diagnosis not present

## 2019-06-13 DIAGNOSIS — R202 Paresthesia of skin: Secondary | ICD-10-CM

## 2019-06-13 DIAGNOSIS — R200 Anesthesia of skin: Secondary | ICD-10-CM

## 2019-06-13 DIAGNOSIS — M5416 Radiculopathy, lumbar region: Secondary | ICD-10-CM

## 2019-06-13 LAB — VITAMIN B12: Vitamin B-12: 361 pg/mL (ref 211–911)

## 2019-06-13 LAB — TSH: TSH: 2.21 u[IU]/mL (ref 0.35–4.50)

## 2019-06-13 NOTE — Patient Instructions (Addendum)
1.  Use walker or cane at all times 2.  Try taking baclofen up to three times daily if needed.  Caution for drowsiness/dizziness!  Only use if you know you won't be on your feet. 3.  Check B12 and TSH 4.  If symptoms do not improve, follow up with me.  Your provider has requested that you have labwork completed today. Please go to [facility] ([id number]) on the second floor of this building before leaving the office today. You do not need to check in. If you are not called within 15 minutes please check with the front desk.

## 2019-06-19 ENCOUNTER — Telehealth: Payer: Self-pay

## 2019-06-19 NOTE — Telephone Encounter (Signed)
Called spoke with patient she was informed of results. 

## 2019-06-19 NOTE — Telephone Encounter (Signed)
-----   Message from Pieter Partridge, DO sent at 06/19/2019 12:22 PM EST ----- Labs are okay

## 2019-06-23 ENCOUNTER — Other Ambulatory Visit: Payer: Self-pay | Admitting: Family Medicine

## 2019-06-23 DIAGNOSIS — Z1231 Encounter for screening mammogram for malignant neoplasm of breast: Secondary | ICD-10-CM

## 2019-07-04 DIAGNOSIS — M25562 Pain in left knee: Secondary | ICD-10-CM | POA: Diagnosis not present

## 2019-07-04 DIAGNOSIS — M1712 Unilateral primary osteoarthritis, left knee: Secondary | ICD-10-CM | POA: Diagnosis not present

## 2019-07-04 DIAGNOSIS — M4326 Fusion of spine, lumbar region: Secondary | ICD-10-CM | POA: Diagnosis not present

## 2019-08-04 DIAGNOSIS — M810 Age-related osteoporosis without current pathological fracture: Secondary | ICD-10-CM | POA: Diagnosis not present

## 2019-08-08 ENCOUNTER — Ambulatory Visit: Payer: Medicare HMO | Attending: Internal Medicine

## 2019-08-08 DIAGNOSIS — Z23 Encounter for immunization: Secondary | ICD-10-CM

## 2019-08-08 NOTE — Progress Notes (Signed)
   Covid-19 Vaccination Clinic  Name:  Theresa [name]    MRN: [id number] DOB: 1933/06/17  08/08/2019  [name] was observed post Covid-19 immunization for 30 minutes based on pre-vaccination screening without incidence. She was provided with Vaccine Information Sheet and instruction to access the V-Safe system.   [name] was instructed to call 911 with any severe reactions post vaccine: Marland Kitchen Difficulty breathing  . Swelling of your face and throat  . A fast heartbeat  . A bad rash all over your body  . Dizziness and weakness    Immunizations Administered    Name Date Dose VIS Date Route   Pfizer COVID-19 Vaccine 08/08/2019  2:41 PM 0.3 mL 06/30/2019 Intramuscular   Manufacturer: [place]   Lot: [id number]   [place]: [place] COVID-19 Vaccine 08/08/2019  2:42 PM 0.3 mL 06/30/2019 Intramuscular   Manufacturer: [place]   Lot: [id number]   [place]: [id number]

## 2019-08-14 ENCOUNTER — Ambulatory Visit
Admission: RE | Admit: 2019-08-14 | Discharge: 2019-08-14 | Disposition: A | Discharge status: Home / Self Care | Payer: Medicare HMO | Source: Ambulatory Visit | Attending: Family Medicine | Admitting: Family Medicine

## 2019-08-14 ENCOUNTER — Other Ambulatory Visit: Payer: Self-pay

## 2019-08-14 DIAGNOSIS — Z1231 Encounter for screening mammogram for malignant neoplasm of breast: Secondary | ICD-10-CM | POA: Diagnosis not present

## 2019-08-28 ENCOUNTER — Ambulatory Visit: Payer: Medicare HMO | Attending: Internal Medicine

## 2019-08-28 DIAGNOSIS — Z23 Encounter for immunization: Secondary | ICD-10-CM

## 2019-08-28 NOTE — Progress Notes (Signed)
   Covid-19 Vaccination Clinic  Name:  TANASHIA BONER    MRN: [id number] DOB: 08/18/1932  08/28/2019  Ms. Sebald was observed post Covid-19 immunization for 30 minutes based on pre-vaccination screening without incidence. She was provided with Vaccine Information Sheet and instruction to access the V-Safe system.   Ms. Pieroni was instructed to call 911 with any severe reactions post vaccine: Marland Kitchen Difficulty breathing  . Swelling of your face and throat  . A fast heartbeat  . A bad rash all over your body  . Dizziness and weakness    Immunizations Administered    Name Date Dose VIS Date Route   Pfizer COVID-19 Vaccine 08/28/2019  1:25 PM 0.3 mL 06/30/2019 Intramuscular   Manufacturer: [place]   Lot: [id number]   [place]: [id number]

## 2019-11-13 DIAGNOSIS — M4326 Fusion of spine, lumbar region: Secondary | ICD-10-CM | POA: Diagnosis not present

## 2019-11-13 DIAGNOSIS — M5106 Intervertebral disc disorders with myelopathy, lumbar region: Secondary | ICD-10-CM | POA: Diagnosis not present

## 2019-11-13 DIAGNOSIS — M47812 Spondylosis without myelopathy or radiculopathy, cervical region: Secondary | ICD-10-CM | POA: Diagnosis not present

## 2019-11-13 DIAGNOSIS — Z6826 Body mass index (BMI) 26.0-26.9, adult: Secondary | ICD-10-CM | POA: Diagnosis not present

## 2019-11-13 DIAGNOSIS — M542 Cervicalgia: Secondary | ICD-10-CM | POA: Diagnosis not present

## 2019-12-14 ENCOUNTER — Other Ambulatory Visit: Payer: Self-pay | Admitting: Family Medicine

## 2019-12-14 ENCOUNTER — Ambulatory Visit
Admission: RE | Admit: 2019-12-14 | Discharge: 2019-12-14 | Disposition: A | Discharge status: Home / Self Care | Payer: Medicare HMO | Source: Ambulatory Visit | Attending: Family Medicine | Admitting: Family Medicine

## 2019-12-14 DIAGNOSIS — M47814 Spondylosis without myelopathy or radiculopathy, thoracic region: Secondary | ICD-10-CM | POA: Diagnosis not present

## 2019-12-14 DIAGNOSIS — M549 Dorsalgia, unspecified: Secondary | ICD-10-CM | POA: Diagnosis not present

## 2019-12-14 DIAGNOSIS — J449 Chronic obstructive pulmonary disease, unspecified: Secondary | ICD-10-CM | POA: Diagnosis not present

## 2019-12-14 DIAGNOSIS — M533 Sacrococcygeal disorders, not elsewhere classified: Secondary | ICD-10-CM | POA: Diagnosis not present

## 2019-12-29 DIAGNOSIS — M9905 Segmental and somatic dysfunction of pelvic region: Secondary | ICD-10-CM | POA: Diagnosis not present

## 2019-12-29 DIAGNOSIS — M9902 Segmental and somatic dysfunction of thoracic region: Secondary | ICD-10-CM | POA: Diagnosis not present

## 2019-12-29 DIAGNOSIS — M5414 Radiculopathy, thoracic region: Secondary | ICD-10-CM | POA: Diagnosis not present

## 2019-12-29 DIAGNOSIS — M5136 Other intervertebral disc degeneration, lumbar region: Secondary | ICD-10-CM | POA: Diagnosis not present

## 2019-12-29 DIAGNOSIS — M5417 Radiculopathy, lumbosacral region: Secondary | ICD-10-CM | POA: Diagnosis not present

## 2019-12-29 DIAGNOSIS — M9903 Segmental and somatic dysfunction of lumbar region: Secondary | ICD-10-CM | POA: Diagnosis not present

## 2020-01-01 DIAGNOSIS — M9903 Segmental and somatic dysfunction of lumbar region: Secondary | ICD-10-CM | POA: Diagnosis not present

## 2020-01-01 DIAGNOSIS — M9902 Segmental and somatic dysfunction of thoracic region: Secondary | ICD-10-CM | POA: Diagnosis not present

## 2020-01-01 DIAGNOSIS — M5136 Other intervertebral disc degeneration, lumbar region: Secondary | ICD-10-CM | POA: Diagnosis not present

## 2020-01-01 DIAGNOSIS — M5414 Radiculopathy, thoracic region: Secondary | ICD-10-CM | POA: Diagnosis not present

## 2020-01-01 DIAGNOSIS — M5417 Radiculopathy, lumbosacral region: Secondary | ICD-10-CM | POA: Diagnosis not present

## 2020-01-01 DIAGNOSIS — M9905 Segmental and somatic dysfunction of pelvic region: Secondary | ICD-10-CM | POA: Diagnosis not present

## 2020-01-02 DIAGNOSIS — M5136 Other intervertebral disc degeneration, lumbar region: Secondary | ICD-10-CM | POA: Diagnosis not present

## 2020-01-02 DIAGNOSIS — M4716 Other spondylosis with myelopathy, lumbar region: Secondary | ICD-10-CM | POA: Diagnosis not present

## 2020-01-02 DIAGNOSIS — M4326 Fusion of spine, lumbar region: Secondary | ICD-10-CM | POA: Diagnosis not present

## 2020-01-02 DIAGNOSIS — M542 Cervicalgia: Secondary | ICD-10-CM | POA: Diagnosis not present

## 2020-01-02 DIAGNOSIS — M5416 Radiculopathy, lumbar region: Secondary | ICD-10-CM | POA: Diagnosis not present

## 2020-01-08 DIAGNOSIS — M79606 Pain in leg, unspecified: Secondary | ICD-10-CM | POA: Diagnosis not present

## 2020-01-08 DIAGNOSIS — G629 Polyneuropathy, unspecified: Secondary | ICD-10-CM | POA: Diagnosis not present

## 2020-01-08 DIAGNOSIS — I8393 Asymptomatic varicose veins of bilateral lower extremities: Secondary | ICD-10-CM | POA: Diagnosis not present

## 2020-01-08 DIAGNOSIS — Z79899 Other long term (current) drug therapy: Secondary | ICD-10-CM | POA: Diagnosis not present

## 2020-01-08 DIAGNOSIS — R252 Cramp and spasm: Secondary | ICD-10-CM | POA: Diagnosis not present

## 2020-01-16 DIAGNOSIS — M79605 Pain in left leg: Secondary | ICD-10-CM | POA: Diagnosis not present

## 2020-01-16 DIAGNOSIS — M79604 Pain in right leg: Secondary | ICD-10-CM | POA: Diagnosis not present

## 2020-01-19 DIAGNOSIS — Z9889 Other specified postprocedural states: Secondary | ICD-10-CM | POA: Diagnosis not present

## 2020-01-19 DIAGNOSIS — M47816 Spondylosis without myelopathy or radiculopathy, lumbar region: Secondary | ICD-10-CM | POA: Diagnosis not present

## 2020-01-19 DIAGNOSIS — M4316 Spondylolisthesis, lumbar region: Secondary | ICD-10-CM | POA: Diagnosis not present

## 2020-01-19 DIAGNOSIS — M5126 Other intervertebral disc displacement, lumbar region: Secondary | ICD-10-CM | POA: Diagnosis not present

## 2020-01-30 DIAGNOSIS — M4716 Other spondylosis with myelopathy, lumbar region: Secondary | ICD-10-CM | POA: Diagnosis not present

## 2020-01-30 DIAGNOSIS — M5136 Other intervertebral disc degeneration, lumbar region: Secondary | ICD-10-CM | POA: Diagnosis not present

## 2020-01-30 DIAGNOSIS — M4326 Fusion of spine, lumbar region: Secondary | ICD-10-CM | POA: Diagnosis not present

## 2020-01-30 DIAGNOSIS — M542 Cervicalgia: Secondary | ICD-10-CM | POA: Diagnosis not present

## 2020-01-31 DIAGNOSIS — M79604 Pain in right leg: Secondary | ICD-10-CM | POA: Diagnosis not present

## 2020-01-31 DIAGNOSIS — M79605 Pain in left leg: Secondary | ICD-10-CM | POA: Diagnosis not present

## 2020-02-02 DIAGNOSIS — M810 Age-related osteoporosis without current pathological fracture: Secondary | ICD-10-CM | POA: Diagnosis not present

## 2020-02-06 DIAGNOSIS — I83812 Varicose veins of left lower extremities with pain: Secondary | ICD-10-CM | POA: Diagnosis not present

## 2020-02-29 DIAGNOSIS — M48062 Spinal stenosis, lumbar region with neurogenic claudication: Secondary | ICD-10-CM | POA: Diagnosis not present

## 2020-02-29 DIAGNOSIS — M542 Cervicalgia: Secondary | ICD-10-CM | POA: Diagnosis not present

## 2020-02-29 DIAGNOSIS — M5136 Other intervertebral disc degeneration, lumbar region: Secondary | ICD-10-CM | POA: Diagnosis not present

## 2020-02-29 DIAGNOSIS — M4326 Fusion of spine, lumbar region: Secondary | ICD-10-CM | POA: Diagnosis not present

## 2020-03-27 ENCOUNTER — Ambulatory Visit: Payer: Medicare HMO | Admitting: Podiatry

## 2020-03-27 ENCOUNTER — Other Ambulatory Visit: Payer: Self-pay

## 2020-03-27 VITALS — Temp 97.5°F

## 2020-03-27 DIAGNOSIS — L600 Ingrowing nail: Secondary | ICD-10-CM

## 2020-03-27 DIAGNOSIS — L603 Nail dystrophy: Secondary | ICD-10-CM

## 2020-03-27 DIAGNOSIS — H5203 Hypermetropia, bilateral: Secondary | ICD-10-CM | POA: Diagnosis not present

## 2020-03-28 DIAGNOSIS — Z0100 Encounter for examination of eyes and vision without abnormal findings: Secondary | ICD-10-CM | POA: Diagnosis not present

## 2020-03-29 ENCOUNTER — Encounter: Payer: Self-pay | Admitting: Podiatry

## 2020-03-29 NOTE — Progress Notes (Signed)
Subjective:  Patient ID: Theresa [name], female    DOB: [birth year],  MRN: [id number]  Chief Complaint  Patient presents with  . Nail Problem    Had a infection in my left toe and i got most of it out but this skin started to grow over it     86 y.o. female presents with the above complaint.  Patient presents with medial left hallux ingrown nail border that has been hurting her a lot.  Patient states it was painful in the past however has gotten better since then.  She would like to have removed.  She denies any other acute complaints.  She has not seen anyone else prior to see me for this.   Review of Systems: Negative except as noted in the HPI. Denies N/V/F/Ch.  Past Medical History:  Diagnosis Date  . Arthritis    Degenerative  . Asthma   . Cervical spondylosis   . Cervicogenic headache   . Gastroesophageal reflux disease   . Headache   . History of renal calculi   . Memory difficulties 02/10/2016  . Mitral valve prolapse     Current Outpatient Medications:  .  aspirin 81 MG EC tablet, aspirin 81 mg tablet,delayed release  Take 1 tablet every day by oral route., Disp: , Rfl:  .  Baclofen 5 MG TABS, , Disp: , Rfl:  .  calcium carbonate (OS-CAL) 1250 (500 Ca) MG chewable tablet, Chew by mouth., Disp: , Rfl:  .  Cholecalciferol (VITAMIN D-1000 MAX ST) 25 MCG (1000 UT) tablet, Take by mouth., Disp: , Rfl:  .  diclofenac Sodium (VOLTAREN) 1 % GEL, , Disp: , Rfl:  .  famotidine (PEPCID) 10 MG tablet, Take by mouth., Disp: , Rfl:  .  ibuprofen (ADVIL,MOTRIN) 200 MG tablet, Take 200 mg by mouth every 6 (six) hours as needed., Disp: , Rfl:  .  Naproxen Sodium (ALEVE) 220 MG CAPS, Aleve 220 mg capsule  Take by oral route., Disp: , Rfl:  .  predniSONE (STERAPRED UNI-PAK 21 TAB) 5 MG (21) TBPK tablet, , Disp: , Rfl:  .  tiZANidine (ZANAFLEX) 2 MG tablet, , Disp: , Rfl:  .  traMADol (ULTRAM) 50 MG tablet, , Disp: , Rfl:  .  vitamin B-12 (CYANOCOBALAMIN) 1000 MCG tablet, Take by  mouth., Disp: , Rfl:   Social History   Tobacco Use  Smoking Status Never Smoker  Smokeless Tobacco Never Used    Allergies  Allergen Reactions  . Amitriptyline Hcl     Elevation in muscle enzymes  . Clindamycin/Lincomycin     Causes esophagitis  . Zithromax [Azithromycin] Rash  . Capsaicin   . Ceclor [Cefaclor] Nausea Only   Objective:   Vitals:   03/27/20 1113  Temp: (!) 97.5 F (36.4 C)   There is no height or weight on file to calculate BMI. Constitutional Well developed. Well nourished.  Vascular Dorsalis pedis pulses palpable bilaterally. Posterior tibial pulses palpable bilaterally. Capillary refill normal to all digits.  No cyanosis or clubbing noted. Pedal hair growth normal.  Neurologic Normal speech. Oriented to person, place, and time. Epicritic sensation to light touch grossly present bilaterally.  Dermatologic Painful ingrowing nail at medial nail borders of the hallux nail left.  There is thickness as well as nail dystrophy present as well. No other open wounds. No skin lesions.  Orthopedic: Normal joint ROM without pain or crepitus bilaterally. No visible deformities. No bony tenderness.   Radiographs: None Assessment:   1. Ingrown left  big toenail   2. Nail dystrophy    Plan:  Patient was evaluated and treated and all questions answered.  Ingrown Nail, left with underlying nail dystrophy -Patient elects to proceed with minor surgery to remove ingrown toenail removal today. Consent reviewed and signed by patient. -Ingrown nail excised. See procedure note. -Educated on post-procedure care including soaking. Written instructions provided and reviewed. -Patient to follow up in 2 weeks for nail check.  Procedure: Excision of Ingrown Toenail Location: Left 1st toe medial nail borders. Anesthesia: Lidocaine 1% plain; 1.5 mL and Marcaine 0.5% plain; 1.5 mL, digital block. Skin Prep: Betadine. Dressing: Silvadene; telfa; dry, sterile, compression  dressing. Technique: Following skin prep, the toe was exsanguinated and a tourniquet was secured at the base of the toe. The affected nail border was freed, split with a nail splitter, and excised. Chemical matrixectomy was then performed with phenol and irrigated out with alcohol. The tourniquet was then removed and sterile dressing applied. Disposition: Patient tolerated procedure well. Patient to return in 2 weeks for follow-up.   No follow-ups on file.

## 2020-04-08 DIAGNOSIS — I8311 Varicose veins of right lower extremity with inflammation: Secondary | ICD-10-CM | POA: Diagnosis not present

## 2020-04-08 DIAGNOSIS — M79605 Pain in left leg: Secondary | ICD-10-CM | POA: Diagnosis not present

## 2020-04-08 DIAGNOSIS — I8312 Varicose veins of left lower extremity with inflammation: Secondary | ICD-10-CM | POA: Diagnosis not present

## 2020-04-11 DIAGNOSIS — M5136 Other intervertebral disc degeneration, lumbar region: Secondary | ICD-10-CM | POA: Diagnosis not present

## 2020-04-11 DIAGNOSIS — M48062 Spinal stenosis, lumbar region with neurogenic claudication: Secondary | ICD-10-CM | POA: Diagnosis not present

## 2020-04-11 DIAGNOSIS — M4326 Fusion of spine, lumbar region: Secondary | ICD-10-CM | POA: Diagnosis not present

## 2020-04-25 DIAGNOSIS — M810 Age-related osteoporosis without current pathological fracture: Secondary | ICD-10-CM | POA: Diagnosis not present

## 2020-04-25 DIAGNOSIS — M1990 Unspecified osteoarthritis, unspecified site: Secondary | ICD-10-CM | POA: Diagnosis not present

## 2020-04-25 DIAGNOSIS — R269 Unspecified abnormalities of gait and mobility: Secondary | ICD-10-CM | POA: Diagnosis not present

## 2020-04-25 DIAGNOSIS — F3342 Major depressive disorder, recurrent, in full remission: Secondary | ICD-10-CM | POA: Diagnosis not present

## 2020-04-25 DIAGNOSIS — M1712 Unilateral primary osteoarthritis, left knee: Secondary | ICD-10-CM | POA: Diagnosis not present

## 2020-04-25 DIAGNOSIS — M5030 Other cervical disc degeneration, unspecified cervical region: Secondary | ICD-10-CM | POA: Diagnosis not present

## 2020-04-25 DIAGNOSIS — E785 Hyperlipidemia, unspecified: Secondary | ICD-10-CM | POA: Diagnosis not present

## 2020-04-25 DIAGNOSIS — M5136 Other intervertebral disc degeneration, lumbar region: Secondary | ICD-10-CM | POA: Diagnosis not present

## 2020-05-10 DIAGNOSIS — M4316 Spondylolisthesis, lumbar region: Secondary | ICD-10-CM | POA: Diagnosis not present

## 2020-05-10 DIAGNOSIS — M48062 Spinal stenosis, lumbar region with neurogenic claudication: Secondary | ICD-10-CM | POA: Diagnosis not present

## 2020-05-10 DIAGNOSIS — M5136 Other intervertebral disc degeneration, lumbar region: Secondary | ICD-10-CM | POA: Diagnosis not present

## 2020-05-10 DIAGNOSIS — M5416 Radiculopathy, lumbar region: Secondary | ICD-10-CM | POA: Diagnosis not present

## 2020-05-23 DIAGNOSIS — M4326 Fusion of spine, lumbar region: Secondary | ICD-10-CM | POA: Diagnosis not present

## 2020-05-23 DIAGNOSIS — M5136 Other intervertebral disc degeneration, lumbar region: Secondary | ICD-10-CM | POA: Diagnosis not present

## 2020-05-23 DIAGNOSIS — M48062 Spinal stenosis, lumbar region with neurogenic claudication: Secondary | ICD-10-CM | POA: Diagnosis not present

## 2020-05-23 DIAGNOSIS — M4316 Spondylolisthesis, lumbar region: Secondary | ICD-10-CM | POA: Diagnosis not present

## 2020-06-25 DIAGNOSIS — M4326 Fusion of spine, lumbar region: Secondary | ICD-10-CM | POA: Diagnosis not present

## 2020-06-25 DIAGNOSIS — M4316 Spondylolisthesis, lumbar region: Secondary | ICD-10-CM | POA: Diagnosis not present

## 2020-06-25 DIAGNOSIS — M4716 Other spondylosis with myelopathy, lumbar region: Secondary | ICD-10-CM | POA: Diagnosis not present

## 2020-06-25 DIAGNOSIS — M48062 Spinal stenosis, lumbar region with neurogenic claudication: Secondary | ICD-10-CM | POA: Diagnosis not present

## 2020-07-01 DIAGNOSIS — M4726 Other spondylosis with radiculopathy, lumbar region: Secondary | ICD-10-CM | POA: Diagnosis not present

## 2020-07-01 DIAGNOSIS — M4326 Fusion of spine, lumbar region: Secondary | ICD-10-CM | POA: Diagnosis not present

## 2020-07-01 DIAGNOSIS — M48062 Spinal stenosis, lumbar region with neurogenic claudication: Secondary | ICD-10-CM | POA: Diagnosis not present

## 2020-07-01 DIAGNOSIS — M961 Postlaminectomy syndrome, not elsewhere classified: Secondary | ICD-10-CM | POA: Diagnosis not present

## 2020-07-01 DIAGNOSIS — I440 Atrioventricular block, first degree: Secondary | ICD-10-CM | POA: Diagnosis not present

## 2020-07-01 DIAGNOSIS — R2681 Unsteadiness on feet: Secondary | ICD-10-CM | POA: Diagnosis not present

## 2020-07-01 DIAGNOSIS — G8918 Other acute postprocedural pain: Secondary | ICD-10-CM | POA: Diagnosis not present

## 2020-07-01 DIAGNOSIS — M4716 Other spondylosis with myelopathy, lumbar region: Secondary | ICD-10-CM | POA: Diagnosis not present

## 2020-07-01 DIAGNOSIS — Z9181 History of falling: Secondary | ICD-10-CM | POA: Diagnosis not present

## 2020-07-01 DIAGNOSIS — I340 Nonrheumatic mitral (valve) insufficiency: Secondary | ICD-10-CM | POA: Diagnosis not present

## 2020-07-01 DIAGNOSIS — Z981 Arthrodesis status: Secondary | ICD-10-CM | POA: Diagnosis not present

## 2020-07-01 DIAGNOSIS — M5106 Intervertebral disc disorders with myelopathy, lumbar region: Secondary | ICD-10-CM | POA: Diagnosis not present

## 2020-07-01 DIAGNOSIS — M4316 Spondylolisthesis, lumbar region: Secondary | ICD-10-CM | POA: Diagnosis not present

## 2020-07-01 DIAGNOSIS — I491 Atrial premature depolarization: Secondary | ICD-10-CM | POA: Diagnosis not present

## 2020-07-02 DIAGNOSIS — M4326 Fusion of spine, lumbar region: Secondary | ICD-10-CM | POA: Diagnosis not present

## 2020-07-02 DIAGNOSIS — M4726 Other spondylosis with radiculopathy, lumbar region: Secondary | ICD-10-CM | POA: Diagnosis not present

## 2020-07-02 DIAGNOSIS — I340 Nonrheumatic mitral (valve) insufficiency: Secondary | ICD-10-CM | POA: Diagnosis not present

## 2020-07-02 DIAGNOSIS — M5106 Intervertebral disc disorders with myelopathy, lumbar region: Secondary | ICD-10-CM | POA: Diagnosis not present

## 2020-07-02 DIAGNOSIS — M48062 Spinal stenosis, lumbar region with neurogenic claudication: Secondary | ICD-10-CM | POA: Diagnosis not present

## 2020-07-02 DIAGNOSIS — Z9181 History of falling: Secondary | ICD-10-CM | POA: Diagnosis not present

## 2020-07-02 DIAGNOSIS — R2681 Unsteadiness on feet: Secondary | ICD-10-CM | POA: Diagnosis not present

## 2020-07-02 DIAGNOSIS — M4316 Spondylolisthesis, lumbar region: Secondary | ICD-10-CM | POA: Diagnosis not present

## 2020-07-02 DIAGNOSIS — Z9889 Other specified postprocedural states: Secondary | ICD-10-CM | POA: Diagnosis not present

## 2020-07-02 DIAGNOSIS — M47816 Spondylosis without myelopathy or radiculopathy, lumbar region: Secondary | ICD-10-CM | POA: Diagnosis not present

## 2020-07-02 DIAGNOSIS — M47817 Spondylosis without myelopathy or radiculopathy, lumbosacral region: Secondary | ICD-10-CM | POA: Diagnosis not present

## 2020-08-01 DIAGNOSIS — M48062 Spinal stenosis, lumbar region with neurogenic claudication: Secondary | ICD-10-CM | POA: Diagnosis not present

## 2020-08-01 DIAGNOSIS — Z981 Arthrodesis status: Secondary | ICD-10-CM | POA: Diagnosis not present

## 2020-08-01 DIAGNOSIS — M4716 Other spondylosis with myelopathy, lumbar region: Secondary | ICD-10-CM | POA: Diagnosis not present

## 2020-08-01 DIAGNOSIS — M4326 Fusion of spine, lumbar region: Secondary | ICD-10-CM | POA: Diagnosis not present

## 2020-08-01 DIAGNOSIS — M4316 Spondylolisthesis, lumbar region: Secondary | ICD-10-CM | POA: Diagnosis not present

## 2020-08-29 DIAGNOSIS — M9905 Segmental and somatic dysfunction of pelvic region: Secondary | ICD-10-CM | POA: Diagnosis not present

## 2020-08-29 DIAGNOSIS — M9903 Segmental and somatic dysfunction of lumbar region: Secondary | ICD-10-CM | POA: Diagnosis not present

## 2020-08-29 DIAGNOSIS — M5136 Other intervertebral disc degeneration, lumbar region: Secondary | ICD-10-CM | POA: Diagnosis not present

## 2020-08-29 DIAGNOSIS — M5414 Radiculopathy, thoracic region: Secondary | ICD-10-CM | POA: Diagnosis not present

## 2020-08-29 DIAGNOSIS — M5417 Radiculopathy, lumbosacral region: Secondary | ICD-10-CM | POA: Diagnosis not present

## 2020-08-29 DIAGNOSIS — M9902 Segmental and somatic dysfunction of thoracic region: Secondary | ICD-10-CM | POA: Diagnosis not present

## 2020-08-30 DIAGNOSIS — M810 Age-related osteoporosis without current pathological fracture: Secondary | ICD-10-CM | POA: Diagnosis not present

## 2020-09-02 DIAGNOSIS — M5417 Radiculopathy, lumbosacral region: Secondary | ICD-10-CM | POA: Diagnosis not present

## 2020-09-02 DIAGNOSIS — M9905 Segmental and somatic dysfunction of pelvic region: Secondary | ICD-10-CM | POA: Diagnosis not present

## 2020-09-02 DIAGNOSIS — S39012A Strain of muscle, fascia and tendon of lower back, initial encounter: Secondary | ICD-10-CM | POA: Diagnosis not present

## 2020-09-02 DIAGNOSIS — S29012A Strain of muscle and tendon of back wall of thorax, initial encounter: Secondary | ICD-10-CM | POA: Diagnosis not present

## 2020-09-02 DIAGNOSIS — M9902 Segmental and somatic dysfunction of thoracic region: Secondary | ICD-10-CM | POA: Diagnosis not present

## 2020-09-02 DIAGNOSIS — M9903 Segmental and somatic dysfunction of lumbar region: Secondary | ICD-10-CM | POA: Diagnosis not present

## 2020-09-05 DIAGNOSIS — M9903 Segmental and somatic dysfunction of lumbar region: Secondary | ICD-10-CM | POA: Diagnosis not present

## 2020-09-05 DIAGNOSIS — M9902 Segmental and somatic dysfunction of thoracic region: Secondary | ICD-10-CM | POA: Diagnosis not present

## 2020-09-05 DIAGNOSIS — S39012A Strain of muscle, fascia and tendon of lower back, initial encounter: Secondary | ICD-10-CM | POA: Diagnosis not present

## 2020-09-05 DIAGNOSIS — S29012A Strain of muscle and tendon of back wall of thorax, initial encounter: Secondary | ICD-10-CM | POA: Diagnosis not present

## 2020-09-05 DIAGNOSIS — M5417 Radiculopathy, lumbosacral region: Secondary | ICD-10-CM | POA: Diagnosis not present

## 2020-09-05 DIAGNOSIS — M9905 Segmental and somatic dysfunction of pelvic region: Secondary | ICD-10-CM | POA: Diagnosis not present

## 2020-09-09 DIAGNOSIS — S29012A Strain of muscle and tendon of back wall of thorax, initial encounter: Secondary | ICD-10-CM | POA: Diagnosis not present

## 2020-09-09 DIAGNOSIS — M9902 Segmental and somatic dysfunction of thoracic region: Secondary | ICD-10-CM | POA: Diagnosis not present

## 2020-09-09 DIAGNOSIS — R399 Unspecified symptoms and signs involving the genitourinary system: Secondary | ICD-10-CM | POA: Diagnosis not present

## 2020-09-09 DIAGNOSIS — S39012A Strain of muscle, fascia and tendon of lower back, initial encounter: Secondary | ICD-10-CM | POA: Diagnosis not present

## 2020-09-09 DIAGNOSIS — M9903 Segmental and somatic dysfunction of lumbar region: Secondary | ICD-10-CM | POA: Diagnosis not present

## 2020-09-09 DIAGNOSIS — M5417 Radiculopathy, lumbosacral region: Secondary | ICD-10-CM | POA: Diagnosis not present

## 2020-09-09 DIAGNOSIS — M9905 Segmental and somatic dysfunction of pelvic region: Secondary | ICD-10-CM | POA: Diagnosis not present

## 2020-09-12 DIAGNOSIS — M9905 Segmental and somatic dysfunction of pelvic region: Secondary | ICD-10-CM | POA: Diagnosis not present

## 2020-09-12 DIAGNOSIS — S29012A Strain of muscle and tendon of back wall of thorax, initial encounter: Secondary | ICD-10-CM | POA: Diagnosis not present

## 2020-09-12 DIAGNOSIS — M5417 Radiculopathy, lumbosacral region: Secondary | ICD-10-CM | POA: Diagnosis not present

## 2020-09-12 DIAGNOSIS — M9903 Segmental and somatic dysfunction of lumbar region: Secondary | ICD-10-CM | POA: Diagnosis not present

## 2020-09-12 DIAGNOSIS — S39012A Strain of muscle, fascia and tendon of lower back, initial encounter: Secondary | ICD-10-CM | POA: Diagnosis not present

## 2020-09-12 DIAGNOSIS — M9902 Segmental and somatic dysfunction of thoracic region: Secondary | ICD-10-CM | POA: Diagnosis not present

## 2020-09-25 DIAGNOSIS — M48062 Spinal stenosis, lumbar region with neurogenic claudication: Secondary | ICD-10-CM | POA: Diagnosis not present

## 2020-09-25 DIAGNOSIS — M7918 Myalgia, other site: Secondary | ICD-10-CM | POA: Diagnosis not present

## 2020-09-26 ENCOUNTER — Other Ambulatory Visit: Payer: Self-pay | Admitting: Family Medicine

## 2020-09-26 DIAGNOSIS — Z1231 Encounter for screening mammogram for malignant neoplasm of breast: Secondary | ICD-10-CM

## 2020-10-03 DIAGNOSIS — M25532 Pain in left wrist: Secondary | ICD-10-CM | POA: Diagnosis not present

## 2020-10-03 DIAGNOSIS — S0081XA Abrasion of other part of head, initial encounter: Secondary | ICD-10-CM | POA: Diagnosis not present

## 2020-10-03 DIAGNOSIS — W010XXA Fall on same level from slipping, tripping and stumbling without subsequent striking against object, initial encounter: Secondary | ICD-10-CM | POA: Diagnosis not present

## 2020-10-03 DIAGNOSIS — M79642 Pain in left hand: Secondary | ICD-10-CM | POA: Diagnosis not present

## 2020-10-11 DIAGNOSIS — R159 Full incontinence of feces: Secondary | ICD-10-CM | POA: Diagnosis not present

## 2020-10-11 DIAGNOSIS — R350 Frequency of micturition: Secondary | ICD-10-CM | POA: Diagnosis not present

## 2020-10-11 DIAGNOSIS — N760 Acute vaginitis: Secondary | ICD-10-CM | POA: Diagnosis not present

## 2020-10-15 DIAGNOSIS — H18593 Other hereditary corneal dystrophies, bilateral: Secondary | ICD-10-CM | POA: Diagnosis not present

## 2020-10-15 DIAGNOSIS — R519 Headache, unspecified: Secondary | ICD-10-CM | POA: Diagnosis not present

## 2020-10-18 ENCOUNTER — Other Ambulatory Visit: Payer: Self-pay | Admitting: Gastroenterology

## 2020-10-18 DIAGNOSIS — R1310 Dysphagia, unspecified: Secondary | ICD-10-CM | POA: Diagnosis not present

## 2020-10-18 DIAGNOSIS — K219 Gastro-esophageal reflux disease without esophagitis: Secondary | ICD-10-CM | POA: Diagnosis not present

## 2020-10-18 DIAGNOSIS — R159 Full incontinence of feces: Secondary | ICD-10-CM | POA: Diagnosis not present

## 2020-10-18 DIAGNOSIS — K649 Unspecified hemorrhoids: Secondary | ICD-10-CM | POA: Diagnosis not present

## 2020-10-18 DIAGNOSIS — K5900 Constipation, unspecified: Secondary | ICD-10-CM | POA: Diagnosis not present

## 2020-10-22 DIAGNOSIS — N9089 Other specified noninflammatory disorders of vulva and perineum: Secondary | ICD-10-CM | POA: Diagnosis not present

## 2020-10-22 DIAGNOSIS — L819 Disorder of pigmentation, unspecified: Secondary | ICD-10-CM | POA: Diagnosis not present

## 2020-10-29 ENCOUNTER — Other Ambulatory Visit: Payer: Self-pay | Admitting: Gastroenterology

## 2020-10-29 ENCOUNTER — Ambulatory Visit
Admission: RE | Admit: 2020-10-29 | Discharge: 2020-10-29 | Disposition: A | Discharge status: Home / Self Care | Payer: Medicare HMO | Source: Ambulatory Visit | Attending: Gastroenterology | Admitting: Gastroenterology

## 2020-10-29 DIAGNOSIS — R1310 Dysphagia, unspecified: Secondary | ICD-10-CM

## 2020-10-29 DIAGNOSIS — K224 Dyskinesia of esophagus: Secondary | ICD-10-CM | POA: Diagnosis not present

## 2020-11-19 ENCOUNTER — Other Ambulatory Visit: Payer: Self-pay

## 2020-11-19 ENCOUNTER — Ambulatory Visit
Admission: RE | Admit: 2020-11-19 | Discharge: 2020-11-19 | Disposition: A | Discharge status: Home / Self Care | Payer: Medicare HMO | Source: Ambulatory Visit | Attending: Family Medicine | Admitting: Family Medicine

## 2020-11-19 DIAGNOSIS — Z1231 Encounter for screening mammogram for malignant neoplasm of breast: Secondary | ICD-10-CM

## 2020-11-25 DIAGNOSIS — M9905 Segmental and somatic dysfunction of pelvic region: Secondary | ICD-10-CM | POA: Diagnosis not present

## 2020-11-25 DIAGNOSIS — M9903 Segmental and somatic dysfunction of lumbar region: Secondary | ICD-10-CM | POA: Diagnosis not present

## 2020-11-25 DIAGNOSIS — S29012A Strain of muscle and tendon of back wall of thorax, initial encounter: Secondary | ICD-10-CM | POA: Diagnosis not present

## 2020-11-25 DIAGNOSIS — S39012A Strain of muscle, fascia and tendon of lower back, initial encounter: Secondary | ICD-10-CM | POA: Diagnosis not present

## 2020-11-25 DIAGNOSIS — M9902 Segmental and somatic dysfunction of thoracic region: Secondary | ICD-10-CM | POA: Diagnosis not present

## 2020-11-25 DIAGNOSIS — M5417 Radiculopathy, lumbosacral region: Secondary | ICD-10-CM | POA: Diagnosis not present

## 2020-11-27 DIAGNOSIS — M9905 Segmental and somatic dysfunction of pelvic region: Secondary | ICD-10-CM | POA: Diagnosis not present

## 2020-11-27 DIAGNOSIS — M9902 Segmental and somatic dysfunction of thoracic region: Secondary | ICD-10-CM | POA: Diagnosis not present

## 2020-11-27 DIAGNOSIS — S39012A Strain of muscle, fascia and tendon of lower back, initial encounter: Secondary | ICD-10-CM | POA: Diagnosis not present

## 2020-11-27 DIAGNOSIS — M5417 Radiculopathy, lumbosacral region: Secondary | ICD-10-CM | POA: Diagnosis not present

## 2020-11-27 DIAGNOSIS — S29012A Strain of muscle and tendon of back wall of thorax, initial encounter: Secondary | ICD-10-CM | POA: Diagnosis not present

## 2020-11-27 DIAGNOSIS — M9903 Segmental and somatic dysfunction of lumbar region: Secondary | ICD-10-CM | POA: Diagnosis not present

## 2020-11-28 DIAGNOSIS — I10 Essential (primary) hypertension: Secondary | ICD-10-CM | POA: Diagnosis not present

## 2020-11-28 DIAGNOSIS — Z6826 Body mass index (BMI) 26.0-26.9, adult: Secondary | ICD-10-CM | POA: Diagnosis not present

## 2020-11-28 DIAGNOSIS — M4326 Fusion of spine, lumbar region: Secondary | ICD-10-CM | POA: Diagnosis not present

## 2020-12-09 DIAGNOSIS — S29012A Strain of muscle and tendon of back wall of thorax, initial encounter: Secondary | ICD-10-CM | POA: Diagnosis not present

## 2020-12-09 DIAGNOSIS — M9903 Segmental and somatic dysfunction of lumbar region: Secondary | ICD-10-CM | POA: Diagnosis not present

## 2020-12-09 DIAGNOSIS — M9905 Segmental and somatic dysfunction of pelvic region: Secondary | ICD-10-CM | POA: Diagnosis not present

## 2020-12-09 DIAGNOSIS — S39012A Strain of muscle, fascia and tendon of lower back, initial encounter: Secondary | ICD-10-CM | POA: Diagnosis not present

## 2020-12-09 DIAGNOSIS — M9902 Segmental and somatic dysfunction of thoracic region: Secondary | ICD-10-CM | POA: Diagnosis not present

## 2020-12-09 DIAGNOSIS — M5417 Radiculopathy, lumbosacral region: Secondary | ICD-10-CM | POA: Diagnosis not present

## 2020-12-11 ENCOUNTER — Telehealth: Payer: Self-pay | Admitting: Neurology

## 2020-12-11 DIAGNOSIS — M9902 Segmental and somatic dysfunction of thoracic region: Secondary | ICD-10-CM | POA: Diagnosis not present

## 2020-12-11 DIAGNOSIS — S39012A Strain of muscle, fascia and tendon of lower back, initial encounter: Secondary | ICD-10-CM | POA: Diagnosis not present

## 2020-12-11 DIAGNOSIS — M5417 Radiculopathy, lumbosacral region: Secondary | ICD-10-CM | POA: Diagnosis not present

## 2020-12-11 DIAGNOSIS — S29012A Strain of muscle and tendon of back wall of thorax, initial encounter: Secondary | ICD-10-CM | POA: Diagnosis not present

## 2020-12-11 DIAGNOSIS — M9903 Segmental and somatic dysfunction of lumbar region: Secondary | ICD-10-CM | POA: Diagnosis not present

## 2020-12-11 DIAGNOSIS — M9905 Segmental and somatic dysfunction of pelvic region: Secondary | ICD-10-CM | POA: Diagnosis not present

## 2020-12-11 NOTE — Telephone Encounter (Signed)
Tried calling pt. No answer. LMOVM please give [place] a call to let us know how she is feeling. If symptoms are worse please what he Access nurse advise go to the ED.

## 2020-12-12 NOTE — Telephone Encounter (Signed)
Due to symptoms, patient needs to be seen in ED.

## 2020-12-13 NOTE — Telephone Encounter (Signed)
2nd attempt at calling the pt no answer. LMOVM to call the office back.

## 2020-12-23 DIAGNOSIS — N9089 Other specified noninflammatory disorders of vulva and perineum: Secondary | ICD-10-CM | POA: Diagnosis not present

## 2020-12-23 DIAGNOSIS — S0990XA Unspecified injury of head, initial encounter: Secondary | ICD-10-CM | POA: Diagnosis not present

## 2021-02-05 DIAGNOSIS — Z79899 Other long term (current) drug therapy: Secondary | ICD-10-CM | POA: Diagnosis not present

## 2021-02-05 DIAGNOSIS — Z79891 Long term (current) use of opiate analgesic: Secondary | ICD-10-CM | POA: Diagnosis not present

## 2021-02-05 DIAGNOSIS — G894 Chronic pain syndrome: Secondary | ICD-10-CM | POA: Diagnosis not present

## 2021-02-05 DIAGNOSIS — M4326 Fusion of spine, lumbar region: Secondary | ICD-10-CM | POA: Diagnosis not present

## 2021-02-05 DIAGNOSIS — M79672 Pain in left foot: Secondary | ICD-10-CM | POA: Diagnosis not present

## 2021-02-05 DIAGNOSIS — M545 Low back pain, unspecified: Secondary | ICD-10-CM | POA: Diagnosis not present

## 2021-02-28 DIAGNOSIS — M810 Age-related osteoporosis without current pathological fracture: Secondary | ICD-10-CM | POA: Diagnosis not present

## 2021-03-07 DIAGNOSIS — K121 Other forms of stomatitis: Secondary | ICD-10-CM | POA: Diagnosis not present

## 2021-03-07 DIAGNOSIS — Z20822 Contact with and (suspected) exposure to covid-19: Secondary | ICD-10-CM | POA: Diagnosis not present

## 2021-03-07 DIAGNOSIS — R499 Unspecified voice and resonance disorder: Secondary | ICD-10-CM | POA: Diagnosis not present

## 2021-03-07 DIAGNOSIS — B349 Viral infection, unspecified: Secondary | ICD-10-CM | POA: Diagnosis not present

## 2021-03-07 DIAGNOSIS — J029 Acute pharyngitis, unspecified: Secondary | ICD-10-CM | POA: Diagnosis not present

## 2021-03-25 DIAGNOSIS — R490 Dysphonia: Secondary | ICD-10-CM | POA: Diagnosis not present

## 2021-03-31 DIAGNOSIS — M9903 Segmental and somatic dysfunction of lumbar region: Secondary | ICD-10-CM | POA: Diagnosis not present

## 2021-03-31 DIAGNOSIS — S29012A Strain of muscle and tendon of back wall of thorax, initial encounter: Secondary | ICD-10-CM | POA: Diagnosis not present

## 2021-03-31 DIAGNOSIS — M9902 Segmental and somatic dysfunction of thoracic region: Secondary | ICD-10-CM | POA: Diagnosis not present

## 2021-03-31 DIAGNOSIS — M5417 Radiculopathy, lumbosacral region: Secondary | ICD-10-CM | POA: Diagnosis not present

## 2021-03-31 DIAGNOSIS — M9905 Segmental and somatic dysfunction of pelvic region: Secondary | ICD-10-CM | POA: Diagnosis not present

## 2021-03-31 DIAGNOSIS — S39012A Strain of muscle, fascia and tendon of lower back, initial encounter: Secondary | ICD-10-CM | POA: Diagnosis not present

## 2021-04-01 DIAGNOSIS — J383 Other diseases of vocal cords: Secondary | ICD-10-CM | POA: Diagnosis not present

## 2021-04-01 DIAGNOSIS — J3489 Other specified disorders of nose and nasal sinuses: Secondary | ICD-10-CM | POA: Diagnosis not present

## 2021-04-02 DIAGNOSIS — M19072 Primary osteoarthritis, left ankle and foot: Secondary | ICD-10-CM | POA: Diagnosis not present

## 2021-04-02 DIAGNOSIS — M948X7 Other specified disorders of cartilage, ankle and foot: Secondary | ICD-10-CM | POA: Diagnosis not present

## 2021-04-02 DIAGNOSIS — M5414 Radiculopathy, thoracic region: Secondary | ICD-10-CM | POA: Diagnosis not present

## 2021-04-02 DIAGNOSIS — M531 Cervicobrachial syndrome: Secondary | ICD-10-CM | POA: Diagnosis not present

## 2021-04-02 DIAGNOSIS — M9901 Segmental and somatic dysfunction of cervical region: Secondary | ICD-10-CM | POA: Diagnosis not present

## 2021-04-02 DIAGNOSIS — M9902 Segmental and somatic dysfunction of thoracic region: Secondary | ICD-10-CM | POA: Diagnosis not present

## 2021-04-02 DIAGNOSIS — M2012 Hallux valgus (acquired), left foot: Secondary | ICD-10-CM | POA: Diagnosis not present

## 2021-04-10 DIAGNOSIS — M5414 Radiculopathy, thoracic region: Secondary | ICD-10-CM | POA: Diagnosis not present

## 2021-04-10 DIAGNOSIS — M531 Cervicobrachial syndrome: Secondary | ICD-10-CM | POA: Diagnosis not present

## 2021-04-10 DIAGNOSIS — M9902 Segmental and somatic dysfunction of thoracic region: Secondary | ICD-10-CM | POA: Diagnosis not present

## 2021-04-10 DIAGNOSIS — M9901 Segmental and somatic dysfunction of cervical region: Secondary | ICD-10-CM | POA: Diagnosis not present

## 2021-04-11 DIAGNOSIS — Z6826 Body mass index (BMI) 26.0-26.9, adult: Secondary | ICD-10-CM | POA: Diagnosis not present

## 2021-04-11 DIAGNOSIS — M79672 Pain in left foot: Secondary | ICD-10-CM | POA: Diagnosis not present

## 2021-04-11 DIAGNOSIS — M4326 Fusion of spine, lumbar region: Secondary | ICD-10-CM | POA: Diagnosis not present

## 2021-04-14 DIAGNOSIS — M5414 Radiculopathy, thoracic region: Secondary | ICD-10-CM | POA: Diagnosis not present

## 2021-04-14 DIAGNOSIS — M9902 Segmental and somatic dysfunction of thoracic region: Secondary | ICD-10-CM | POA: Diagnosis not present

## 2021-04-14 DIAGNOSIS — M531 Cervicobrachial syndrome: Secondary | ICD-10-CM | POA: Diagnosis not present

## 2021-04-14 DIAGNOSIS — M9901 Segmental and somatic dysfunction of cervical region: Secondary | ICD-10-CM | POA: Diagnosis not present

## 2021-04-17 DIAGNOSIS — H5203 Hypermetropia, bilateral: Secondary | ICD-10-CM | POA: Diagnosis not present

## 2021-04-17 DIAGNOSIS — M9902 Segmental and somatic dysfunction of thoracic region: Secondary | ICD-10-CM | POA: Diagnosis not present

## 2021-04-17 DIAGNOSIS — M9901 Segmental and somatic dysfunction of cervical region: Secondary | ICD-10-CM | POA: Diagnosis not present

## 2021-04-17 DIAGNOSIS — M5414 Radiculopathy, thoracic region: Secondary | ICD-10-CM | POA: Diagnosis not present

## 2021-04-17 DIAGNOSIS — M531 Cervicobrachial syndrome: Secondary | ICD-10-CM | POA: Diagnosis not present

## 2021-05-07 DIAGNOSIS — M9902 Segmental and somatic dysfunction of thoracic region: Secondary | ICD-10-CM | POA: Diagnosis not present

## 2021-05-07 DIAGNOSIS — M9901 Segmental and somatic dysfunction of cervical region: Secondary | ICD-10-CM | POA: Diagnosis not present

## 2021-05-07 DIAGNOSIS — M531 Cervicobrachial syndrome: Secondary | ICD-10-CM | POA: Diagnosis not present

## 2021-05-07 DIAGNOSIS — M5414 Radiculopathy, thoracic region: Secondary | ICD-10-CM | POA: Diagnosis not present

## 2021-05-09 DIAGNOSIS — M9901 Segmental and somatic dysfunction of cervical region: Secondary | ICD-10-CM | POA: Diagnosis not present

## 2021-05-09 DIAGNOSIS — M531 Cervicobrachial syndrome: Secondary | ICD-10-CM | POA: Diagnosis not present

## 2021-05-09 DIAGNOSIS — M9902 Segmental and somatic dysfunction of thoracic region: Secondary | ICD-10-CM | POA: Diagnosis not present

## 2021-05-09 DIAGNOSIS — M5414 Radiculopathy, thoracic region: Secondary | ICD-10-CM | POA: Diagnosis not present

## 2021-05-12 DIAGNOSIS — M9902 Segmental and somatic dysfunction of thoracic region: Secondary | ICD-10-CM | POA: Diagnosis not present

## 2021-05-12 DIAGNOSIS — M531 Cervicobrachial syndrome: Secondary | ICD-10-CM | POA: Diagnosis not present

## 2021-05-12 DIAGNOSIS — M5414 Radiculopathy, thoracic region: Secondary | ICD-10-CM | POA: Diagnosis not present

## 2021-05-12 DIAGNOSIS — M9901 Segmental and somatic dysfunction of cervical region: Secondary | ICD-10-CM | POA: Diagnosis not present

## 2021-05-14 DIAGNOSIS — M9901 Segmental and somatic dysfunction of cervical region: Secondary | ICD-10-CM | POA: Diagnosis not present

## 2021-05-14 DIAGNOSIS — M531 Cervicobrachial syndrome: Secondary | ICD-10-CM | POA: Diagnosis not present

## 2021-05-14 DIAGNOSIS — M9902 Segmental and somatic dysfunction of thoracic region: Secondary | ICD-10-CM | POA: Diagnosis not present

## 2021-05-14 DIAGNOSIS — M5414 Radiculopathy, thoracic region: Secondary | ICD-10-CM | POA: Diagnosis not present

## 2021-05-15 DIAGNOSIS — M5031 Other cervical disc degeneration,  high cervical region: Secondary | ICD-10-CM | POA: Diagnosis not present

## 2021-05-15 DIAGNOSIS — M4322 Fusion of spine, cervical region: Secondary | ICD-10-CM | POA: Diagnosis not present

## 2021-05-15 DIAGNOSIS — M542 Cervicalgia: Secondary | ICD-10-CM | POA: Diagnosis not present

## 2021-05-19 DIAGNOSIS — R519 Headache, unspecified: Secondary | ICD-10-CM | POA: Diagnosis not present

## 2021-05-19 DIAGNOSIS — Z23 Encounter for immunization: Secondary | ICD-10-CM | POA: Diagnosis not present

## 2021-05-19 DIAGNOSIS — R42 Dizziness and giddiness: Secondary | ICD-10-CM | POA: Diagnosis not present

## 2021-05-28 DIAGNOSIS — M5414 Radiculopathy, thoracic region: Secondary | ICD-10-CM | POA: Diagnosis not present

## 2021-05-28 DIAGNOSIS — M9902 Segmental and somatic dysfunction of thoracic region: Secondary | ICD-10-CM | POA: Diagnosis not present

## 2021-05-28 DIAGNOSIS — M9901 Segmental and somatic dysfunction of cervical region: Secondary | ICD-10-CM | POA: Diagnosis not present

## 2021-05-28 DIAGNOSIS — M531 Cervicobrachial syndrome: Secondary | ICD-10-CM | POA: Diagnosis not present

## 2021-05-30 DIAGNOSIS — M9901 Segmental and somatic dysfunction of cervical region: Secondary | ICD-10-CM | POA: Diagnosis not present

## 2021-05-30 DIAGNOSIS — M5414 Radiculopathy, thoracic region: Secondary | ICD-10-CM | POA: Diagnosis not present

## 2021-05-30 DIAGNOSIS — M9902 Segmental and somatic dysfunction of thoracic region: Secondary | ICD-10-CM | POA: Diagnosis not present

## 2021-05-30 DIAGNOSIS — M531 Cervicobrachial syndrome: Secondary | ICD-10-CM | POA: Diagnosis not present

## 2021-06-02 DIAGNOSIS — I700 Atherosclerosis of aorta: Secondary | ICD-10-CM | POA: Diagnosis not present

## 2021-06-02 DIAGNOSIS — L989 Disorder of the skin and subcutaneous tissue, unspecified: Secondary | ICD-10-CM | POA: Diagnosis not present

## 2021-06-02 DIAGNOSIS — R946 Abnormal results of thyroid function studies: Secondary | ICD-10-CM | POA: Diagnosis not present

## 2021-06-02 DIAGNOSIS — S0511XA Contusion of eyeball and orbital tissues, right eye, initial encounter: Secondary | ICD-10-CM | POA: Diagnosis not present

## 2021-06-02 DIAGNOSIS — M810 Age-related osteoporosis without current pathological fracture: Secondary | ICD-10-CM | POA: Diagnosis not present

## 2021-06-02 DIAGNOSIS — E785 Hyperlipidemia, unspecified: Secondary | ICD-10-CM | POA: Diagnosis not present

## 2021-06-02 DIAGNOSIS — W19XXXA Unspecified fall, initial encounter: Secondary | ICD-10-CM | POA: Diagnosis not present

## 2021-06-02 DIAGNOSIS — Z0000 Encounter for general adult medical examination without abnormal findings: Secondary | ICD-10-CM | POA: Diagnosis not present

## 2021-06-02 DIAGNOSIS — Z79899 Other long term (current) drug therapy: Secondary | ICD-10-CM | POA: Diagnosis not present

## 2021-06-02 DIAGNOSIS — Z8673 Personal history of transient ischemic attack (TIA), and cerebral infarction without residual deficits: Secondary | ICD-10-CM | POA: Diagnosis not present

## 2021-06-03 DIAGNOSIS — M5031 Other cervical disc degeneration,  high cervical region: Secondary | ICD-10-CM | POA: Diagnosis not present

## 2021-06-03 DIAGNOSIS — M4804 Spinal stenosis, thoracic region: Secondary | ICD-10-CM | POA: Diagnosis not present

## 2021-06-03 DIAGNOSIS — M47812 Spondylosis without myelopathy or radiculopathy, cervical region: Secondary | ICD-10-CM | POA: Diagnosis not present

## 2021-06-03 DIAGNOSIS — M5134 Other intervertebral disc degeneration, thoracic region: Secondary | ICD-10-CM | POA: Diagnosis not present

## 2021-06-03 DIAGNOSIS — Z8673 Personal history of transient ischemic attack (TIA), and cerebral infarction without residual deficits: Secondary | ICD-10-CM | POA: Diagnosis not present

## 2021-06-03 DIAGNOSIS — M4802 Spinal stenosis, cervical region: Secondary | ICD-10-CM | POA: Diagnosis not present

## 2021-06-03 DIAGNOSIS — R9389 Abnormal findings on diagnostic imaging of other specified body structures: Secondary | ICD-10-CM | POA: Diagnosis not present

## 2021-06-03 DIAGNOSIS — G939 Disorder of brain, unspecified: Secondary | ICD-10-CM | POA: Diagnosis not present

## 2021-06-03 DIAGNOSIS — M4312 Spondylolisthesis, cervical region: Secondary | ICD-10-CM | POA: Diagnosis not present

## 2021-06-05 ENCOUNTER — Other Ambulatory Visit (HOSPITAL_COMMUNITY): Payer: Self-pay

## 2021-06-05 ENCOUNTER — Other Ambulatory Visit: Payer: Self-pay | Admitting: Family Medicine

## 2021-06-05 DIAGNOSIS — M47812 Spondylosis without myelopathy or radiculopathy, cervical region: Secondary | ICD-10-CM | POA: Diagnosis not present

## 2021-06-05 DIAGNOSIS — M810 Age-related osteoporosis without current pathological fracture: Secondary | ICD-10-CM

## 2021-06-05 DIAGNOSIS — M542 Cervicalgia: Secondary | ICD-10-CM | POA: Diagnosis not present

## 2021-06-05 DIAGNOSIS — M7551 Bursitis of right shoulder: Secondary | ICD-10-CM | POA: Diagnosis not present

## 2021-06-05 DIAGNOSIS — M4322 Fusion of spine, cervical region: Secondary | ICD-10-CM | POA: Diagnosis not present

## 2021-06-05 DIAGNOSIS — R9089 Other abnormal findings on diagnostic imaging of central nervous system: Secondary | ICD-10-CM

## 2021-06-05 DIAGNOSIS — R1310 Dysphagia, unspecified: Secondary | ICD-10-CM

## 2021-06-06 NOTE — Progress Notes (Signed)
NEUROLOGY FOLLOW UP OFFICE NOTE  Theresa [name] [id number]  Assessment/Plan:   Gait instability - chronic problem.  Likely multifactorial, related to underlying neuropathy, arthritis or residual to lumbar spinal stenosis. I would suspect that the cerebellar infarct is unrelated as it was noted on MRI from 2017 and she reported balance problems beginning in 2020. Chronic cerebellar infarct - from my review of both recent imaging and prior imaging from 2017, I do not appreciate any significant change.  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 2  Will consider small dose of gabapentin 100mg  at bedtime to treat headaches.  She would like to wait until imaging is completed. 3  Follow up   Subjective:  Theresa [name] is an 87 year old right-handed female with cervical spondylosis, cervicobrachial syndrome, cervicogenic headache, arthritis, MVP and asthma who follows up for headaches and falls. MRI of brain and cervical spine on 06/03/2021 personally reviewed.  UPDATE: She had a recent fall, in which she was standing at a table and when she turned, her foot got tangled in the table cloth.  She fell striking her right eye and right arm.  She has had headache since then, bifrontal pressure.  Still has occipital headaches as well.  She has neck pain with difficulty turning her head to the right.  MRI of brain without contrast on 06/03/2021 personally reviewed showed 5.7 mm T2/FLAIR hyperintense focus within the right upper aspect of the cerebellum as well as possible 6.7 mm aneurysm at location of the anterior communicating artery.  MRI of cervical spine showed ACDF at C4-7 and posterior fusion at C4-T2 with multilevel degenerative changes including mild to moderate canal stenosis at  C2-3, moderate bilateral foraminal stenosis at C3-4, T2-3 and T3-4.  HISTORY: In late September-early October 2020, she tripped and fell on her patio, hitting the left side of her forehead on the concrete.  A few days later, she tripped again over a tree branch and striking the right side of her head on the ground.  She did not lose consciousness with either of these falls.  CT head without contrast from 05/05/2019 was personally reviewed and demonstrated no acute intracranial abnormality.  She reports some lightheadedness since the fall.  When she gets up in the morning, she has to sit on side of bed until she can focus.  It may occur spontaneously as well.  She still has some pain on the left side of her head down the left posterior side of her neck. She does have a history of left sided occipital/cervicogenic pressure and paroxysmal headache.  She also has history of cervical spondylosis with prior cervical fusion.  She had previously had cervicogenic headaches several years ago, which subsequently resolved.    She has known lumbar spinal stenosis.  She underwent L3-4 and L4-L5 laminectomy with L4-L5 fusion on 01/10/2019.  She reports residual left leg weakness and left knee pain.  At times, she still reports pain when she walks.  She reports numbness in the feet from toes up to ankles.  She reports arthritis. B12 and TSH from November 2020 were 361 and 2.21 respectively.   MRI brain without contrast from 02/19/2016 personally reviewed showed chronic small vessel ischemic changes in the periventricular and subcortical cerebral white matter, as well as lacunar infarct in  right cerebellar hemisphere, stable compared to prior MRI from 07/15/2012.    PAST MEDICAL HISTORY: Past Medical History:  Diagnosis Date   Arthritis    Degenerative   Asthma    Cervical spondylosis    Cervicogenic headache    Gastroesophageal reflux disease    Headache    History of renal calculi    Memory difficulties 02/10/2016    Mitral valve prolapse     MEDICATIONS: Current Outpatient Medications on File Prior to Visit  Medication Sig Dispense Refill   aspirin 81 MG EC tablet aspirin 81 mg tablet,delayed release  Take 1 tablet every day by oral route.     Baclofen 5 MG TABS      calcium carbonate (OS-CAL) 1250 (500 Ca) MG chewable tablet Chew by mouth.     Cholecalciferol (VITAMIN D-1000 MAX ST) 25 MCG (1000 UT) tablet Take by mouth.     diclofenac Sodium (VOLTAREN) 1 % GEL      famotidine (PEPCID) 10 MG tablet Take by mouth.     ibuprofen (ADVIL,MOTRIN) 200 MG tablet Take 200 mg by mouth every 6 (six) hours as needed.     Naproxen Sodium (ALEVE) 220 MG CAPS Aleve 220 mg capsule  Take by oral route.     predniSONE (STERAPRED UNI-PAK 21 TAB) 5 MG (21) TBPK tablet      tiZANidine (ZANAFLEX) 2 MG tablet      traMADol (ULTRAM) 50 MG tablet      vitamin B-12 (CYANOCOBALAMIN) 1000 MCG tablet Take by mouth.     No current facility-administered medications on file prior to visit.    ALLERGIES: Allergies  Allergen Reactions   Amitriptyline Hcl     Elevation in muscle enzymes   Clindamycin/Lincomycin     Causes esophagitis   Zithromax [Azithromycin] Rash   Capsaicin    Ceclor [Cefaclor] Nausea Only    FAMILY HISTORY: Family History  Problem Relation Age of Onset   Heart attack Father    Heart disease Sister    Cancer Sister    Breast cancer Sister    Coronary artery disease Brother    Diabetes Sister    Alzheimer's disease Brother    Diabetes Brother       Objective:  Blood pressure (!) 144/80, pulse 77, height 4\' 11"  (1.499 m), weight 142 lb 6.4 oz (64.6 kg), SpO2 91 %. General: No acute distress.  Patient appears well-groomed.   Head:  Normocephalic/atraumatic Eyes:  Fundi examined but not visualized Neck: supple, no paraspinal tenderness, full range of motion Heart:  Regular rate and rhythm Lungs:  Clear to auscultation bilaterally Back: No paraspinal tenderness Neurological Exam:  alert and oriented to person, place, and time.  Speech fluent and not dysarthric, language intact.  CN II-XII intact. Bulk and tone normal, muscle strength 5/5 throughout.  Sensation to light touch intact.  Deep tendon reflexes 2+ throughout, toes downgoing.  Finger to nose testing intact.  Gait broad-based and unsteady.  Cannot tandem walk.  Romberg with sway.     Metta Clines, DO  CC: Harlan Stains, MD

## 2021-06-09 ENCOUNTER — Other Ambulatory Visit: Payer: Self-pay

## 2021-06-09 ENCOUNTER — Ambulatory Visit: Payer: Medicare HMO | Admitting: Neurology

## 2021-06-09 ENCOUNTER — Encounter: Payer: Self-pay | Admitting: Neurology

## 2021-06-09 VITALS — BP 144/80 | HR 77 | Ht 59.0 in | Wt 142.4 lb

## 2021-06-09 DIAGNOSIS — M5414 Radiculopathy, thoracic region: Secondary | ICD-10-CM | POA: Diagnosis not present

## 2021-06-09 DIAGNOSIS — I671 Cerebral aneurysm, nonruptured: Secondary | ICD-10-CM | POA: Diagnosis not present

## 2021-06-09 DIAGNOSIS — I639 Cerebral infarction, unspecified: Secondary | ICD-10-CM

## 2021-06-09 DIAGNOSIS — M9902 Segmental and somatic dysfunction of thoracic region: Secondary | ICD-10-CM | POA: Diagnosis not present

## 2021-06-09 DIAGNOSIS — M9901 Segmental and somatic dysfunction of cervical region: Secondary | ICD-10-CM | POA: Diagnosis not present

## 2021-06-09 DIAGNOSIS — R2681 Unsteadiness on feet: Secondary | ICD-10-CM | POA: Diagnosis not present

## 2021-06-09 DIAGNOSIS — M531 Cervicobrachial syndrome: Secondary | ICD-10-CM | POA: Diagnosis not present

## 2021-06-09 DIAGNOSIS — M47812 Spondylosis without myelopathy or radiculopathy, cervical region: Secondary | ICD-10-CM

## 2021-06-09 NOTE — Patient Instructions (Signed)
Follow up after MRIs When ready, contact me for gabapentin to treat headache.

## 2021-06-16 ENCOUNTER — Other Ambulatory Visit: Payer: Self-pay

## 2021-06-16 ENCOUNTER — Ambulatory Visit (HOSPITAL_COMMUNITY)
Admission: RE | Admit: 2021-06-16 | Discharge: 2021-06-16 | Disposition: A | Discharge status: Home / Self Care | Payer: Medicare HMO | Source: Ambulatory Visit | Attending: Family Medicine | Admitting: Family Medicine

## 2021-06-16 DIAGNOSIS — Z8673 Personal history of transient ischemic attack (TIA), and cerebral infarction without residual deficits: Secondary | ICD-10-CM | POA: Insufficient documentation

## 2021-06-16 DIAGNOSIS — Z8616 Personal history of COVID-19: Secondary | ICD-10-CM | POA: Insufficient documentation

## 2021-06-16 DIAGNOSIS — R1310 Dysphagia, unspecified: Secondary | ICD-10-CM | POA: Insufficient documentation

## 2021-06-16 DIAGNOSIS — R059 Cough, unspecified: Secondary | ICD-10-CM | POA: Insufficient documentation

## 2021-06-16 NOTE — Progress Notes (Signed)
Modified Barium Swallow Progress Note  Patient Details  Name: Theresa [name] MRN: [id number] Date of Birth: 08/15/1932  Today's Date: 06/16/2021  Modified Barium Swallow completed.  Full report located under Chart Review in the Imaging Section.  Brief recommendations include the following:  Clinical Impression  Pt exhibits normal oral and oropharyngeal function. She did have one instance of trace sensed aspiration while trying to drink liquids to swallow pill. Cough reponse immediate and sufficient to eject trace aspirate. Not outside of normal swallowing function for age. Reviewed basic compensatory strategies. Pt likely experiencing some mild inflammatory changes after  COVID, impacting vocal quality and cough sensistivity. WOuld benefit from f/u SLP interventions for cough suppression therapy and vocal hygiene. Referral to voice specialist ideal, but pt verbalizes she would not want to travel to [facility] or [place]. Could f/u locally with SLP first.   Swallow Evaluation Recommendations       SLP Diet Recommendations: Regular solids;Thin liquid   Liquid Administration via: Cup;Straw   Medication Administration: Whole meds with puree   Supervision: Patient able to self feed   Compensations: Slow rate;Small sips/bites   Postural Changes: Remain semi-upright after after feeds/meals (Comment)            Lofton Leon, Katherene Ponto 06/16/2021,12:00 PM

## 2021-06-17 ENCOUNTER — Ambulatory Visit: Payer: Medicare HMO | Admitting: Dermatology

## 2021-06-17 DIAGNOSIS — L821 Other seborrheic keratosis: Secondary | ICD-10-CM | POA: Diagnosis not present

## 2021-06-17 DIAGNOSIS — Z85828 Personal history of other malignant neoplasm of skin: Secondary | ICD-10-CM | POA: Diagnosis not present

## 2021-06-17 DIAGNOSIS — L570 Actinic keratosis: Secondary | ICD-10-CM | POA: Diagnosis not present

## 2021-06-26 DIAGNOSIS — M9901 Segmental and somatic dysfunction of cervical region: Secondary | ICD-10-CM | POA: Diagnosis not present

## 2021-06-26 DIAGNOSIS — M5414 Radiculopathy, thoracic region: Secondary | ICD-10-CM | POA: Diagnosis not present

## 2021-06-26 DIAGNOSIS — G939 Disorder of brain, unspecified: Secondary | ICD-10-CM | POA: Diagnosis not present

## 2021-06-26 DIAGNOSIS — M9902 Segmental and somatic dysfunction of thoracic region: Secondary | ICD-10-CM | POA: Diagnosis not present

## 2021-06-26 DIAGNOSIS — I671 Cerebral aneurysm, nonruptured: Secondary | ICD-10-CM | POA: Diagnosis not present

## 2021-06-26 DIAGNOSIS — M531 Cervicobrachial syndrome: Secondary | ICD-10-CM | POA: Diagnosis not present

## 2021-06-30 DIAGNOSIS — N766 Ulceration of vulva: Secondary | ICD-10-CM | POA: Diagnosis not present

## 2021-06-30 DIAGNOSIS — N368 Other specified disorders of urethra: Secondary | ICD-10-CM | POA: Diagnosis not present

## 2021-07-01 ENCOUNTER — Telehealth: Payer: Self-pay | Admitting: Neurology

## 2021-07-01 NOTE — Telephone Encounter (Signed)
Patient is returning a call to [place].

## 2021-07-03 NOTE — Telephone Encounter (Signed)
I spoke to Theresa [name] - the cerebellar lesion appears to be a benign etiology such as a chronic lacunar infarct or glioma.  She reports that this has been seen on prior MRIs from years ago.  There is an acom aneurysm.  Would refer to interventionist.  She told me that her PCP already has referred her to someone.  She will let me know who it is.

## 2021-07-05 ENCOUNTER — Encounter: Payer: Self-pay | Admitting: Dermatology

## 2021-07-05 NOTE — Progress Notes (Signed)
° °  Follow-Up Visit   Subjective  Theresa [name] is a 87 y.o. female who presents for the following: Skin Problem (Lesion on left forearm. PCP said it needs to be removed. Also check right cheek. Bigger and darker x years. ).  Several spots to check on face and arms. Location:  Duration:  Quality:  Associated Signs/Symptoms: Modifying Factors:  Severity:  Timing: Context:   Objective  Well appearing patient in no apparent distress; mood and affect are within normal limits. Left Forearm - Posterior Hornlike 6 mm pink crust  Left Forehead No sign recurrence  Right Buccal Cheek Tan flattopped textured 6 mm papule    All sun exposed areas plus back examined.   Assessment & Plan    AK (actinic keratosis) Left Forearm - Posterior  Destruction of lesion - Left Forearm - Posterior Complexity: simple   Destruction method: cryotherapy   Informed consent: discussed and consent obtained   Timeout:  patient name, date of birth, surgical site, and procedure verified Lesion destroyed using liquid nitrogen: Yes   Cryotherapy cycles:  3 Outcome: patient tolerated procedure well with no complications   Post-procedure details: wound care instructions given    Personal history of skin cancer Left Forehead  Recheck as needed change  Seborrheic keratosis Right Buccal Cheek  Leave if stable      I, Lavonna Monarch, MD, have reviewed all documentation for this visit.  The documentation on 07/05/21 for the exam, diagnosis, procedures, and orders are all accurate and complete.

## 2021-07-09 ENCOUNTER — Telehealth (HOSPITAL_COMMUNITY): Payer: Self-pay

## 2021-07-09 ENCOUNTER — Other Ambulatory Visit (HOSPITAL_COMMUNITY): Payer: Self-pay | Admitting: Interventional Radiology

## 2021-07-09 DIAGNOSIS — I671 Cerebral aneurysm, nonruptured: Secondary | ICD-10-CM

## 2021-07-09 NOTE — Telephone Encounter (Signed)
Called to schedule consult with Dr. Deveshwar, no answer, left vm. AW  

## 2021-07-22 ENCOUNTER — Ambulatory Visit (HOSPITAL_COMMUNITY)
Admission: RE | Admit: 2021-07-22 | Discharge: 2021-07-22 | Disposition: A | Discharge status: Home / Self Care | Payer: Medicare HMO | Source: Ambulatory Visit | Attending: Interventional Radiology | Admitting: Interventional Radiology

## 2021-07-22 ENCOUNTER — Other Ambulatory Visit: Payer: Self-pay

## 2021-07-22 DIAGNOSIS — I671 Cerebral aneurysm, nonruptured: Secondary | ICD-10-CM

## 2021-07-24 DIAGNOSIS — J209 Acute bronchitis, unspecified: Secondary | ICD-10-CM | POA: Diagnosis not present

## 2021-07-24 DIAGNOSIS — J4521 Mild intermittent asthma with (acute) exacerbation: Secondary | ICD-10-CM | POA: Diagnosis not present

## 2021-07-24 DIAGNOSIS — J019 Acute sinusitis, unspecified: Secondary | ICD-10-CM | POA: Diagnosis not present

## 2021-07-28 HISTORY — PX: IR RADIOLOGIST EVAL & MGMT: IMG5224

## 2021-08-06 DIAGNOSIS — M531 Cervicobrachial syndrome: Secondary | ICD-10-CM | POA: Diagnosis not present

## 2021-08-06 DIAGNOSIS — M5414 Radiculopathy, thoracic region: Secondary | ICD-10-CM | POA: Diagnosis not present

## 2021-08-06 DIAGNOSIS — M9902 Segmental and somatic dysfunction of thoracic region: Secondary | ICD-10-CM | POA: Diagnosis not present

## 2021-08-06 DIAGNOSIS — M9901 Segmental and somatic dysfunction of cervical region: Secondary | ICD-10-CM | POA: Diagnosis not present

## 2021-08-08 ENCOUNTER — Other Ambulatory Visit (HOSPITAL_COMMUNITY): Payer: Self-pay | Admitting: Interventional Radiology

## 2021-08-08 ENCOUNTER — Telehealth: Payer: Self-pay | Admitting: Internal Medicine

## 2021-08-08 DIAGNOSIS — I671 Cerebral aneurysm, nonruptured: Secondary | ICD-10-CM

## 2021-08-08 MED ORDER — CLOPIDOGREL BISULFATE 75 MG PO TABS: 75.0000 mg | ORAL_TABLET | Freq: Every day | ORAL | 6 refills | Status: DC

## 2021-08-08 NOTE — Progress Notes (Signed)
Pt was called and advised that her prescription for Plavix has been sent to the [place] at [facility].     Narda Rutherford, AGNP-BC 08/08/2021, 4:06 PM

## 2021-08-13 ENCOUNTER — Other Ambulatory Visit (HOSPITAL_COMMUNITY): Payer: Self-pay | Admitting: Interventional Radiology

## 2021-08-13 ENCOUNTER — Telehealth: Payer: Self-pay | Admitting: Neurology

## 2021-08-13 NOTE — Telephone Encounter (Signed)
Patient needs a call to figure out what is going on with her referral.

## 2021-08-13 NOTE — Telephone Encounter (Signed)
Per pt she needs to get in contact with Dr. Estanislado Pandy office.  Advised pt to call [phone] and chose the option for Interventional Radiology.      If this is in regards to the aneurysm, it appears that her PCP has referred her to [facility] whom she initially saw on the 9th and she is seeing again on the 30th.

## 2021-08-14 ENCOUNTER — Telehealth (HOSPITAL_COMMUNITY): Payer: Self-pay

## 2021-08-14 ENCOUNTER — Other Ambulatory Visit: Payer: Self-pay | Admitting: Radiology

## 2021-08-14 ENCOUNTER — Other Ambulatory Visit (HOSPITAL_COMMUNITY): Payer: Self-pay

## 2021-08-14 DIAGNOSIS — I729 Aneurysm of unspecified site: Secondary | ICD-10-CM

## 2021-08-14 LAB — SARS CORONAVIRUS 2 (TAT 6-24 HRS): SARS Coronavirus 2: NEGATIVE

## 2021-08-15 ENCOUNTER — Other Ambulatory Visit (HOSPITAL_COMMUNITY): Payer: Self-pay | Admitting: Physician Assistant

## 2021-08-15 ENCOUNTER — Encounter (HOSPITAL_COMMUNITY): Payer: Self-pay | Admitting: Interventional Radiology

## 2021-08-15 ENCOUNTER — Other Ambulatory Visit: Payer: Self-pay | Admitting: Student

## 2021-08-15 ENCOUNTER — Other Ambulatory Visit: Payer: Self-pay

## 2021-08-15 NOTE — Progress Notes (Signed)
Spoke with pt for pre-op call. Pt denies cardiac history, HTN or Diabetes. She states she was told that a stroke was seen on an MRI but pt has no idea when it occurred.   Covid test done 08/13/21 and it's negative. Pt states she is wearing her mask whenever she goes out.

## 2021-08-17 NOTE — Anesthesia Preprocedure Evaluation (Addendum)
Anesthesia Evaluation  Patient identified by MRN, date of birth, ID band Patient awake    Reviewed: Allergy & Precautions, NPO status , Patient's Chart, lab work & pertinent test results  Airway Mallampati: II  TM Distance: >3 FB Neck ROM: Full    Dental  (+) Upper Dentures, Lower Dentures   Pulmonary asthma ,    Pulmonary exam normal        Cardiovascular + Valvular Problems/Murmurs MVP  Rhythm:Regular Rate:Normal     Neuro/Psych  Headaches, aneurysm CVA negative psych ROS   GI/Hepatic Neg liver ROS, GERD  ,  Endo/Other  negative endocrine ROS  Renal/GU negative Renal ROS  negative genitourinary   Musculoskeletal  (+) Arthritis , Osteoarthritis,  Prior C/spine surgery    Abdominal Normal abdominal exam  (+)   Peds  Hematology  (+) anemia ,   Anesthesia Other Findings   Reproductive/Obstetrics                            Anesthesia Physical Anesthesia Plan  ASA: 3  Anesthesia Plan: General   Post-op Pain Management:    Induction: Intravenous  PONV Risk Score and Plan: 3 and Ondansetron, Dexamethasone and Treatment may vary due to age or medical condition  Airway Management Planned: Mask and Oral ETT  Additional Equipment: Arterial line  Intra-op Plan:   Post-operative Plan: Possible Post-op intubation/ventilation  Informed Consent: I have reviewed the patients History and Physical, chart, labs and discussed the procedure including the risks, benefits and alternatives for the proposed anesthesia with the patient or authorized representative who has indicated his/her understanding and acceptance.     Dental advisory given  Plan Discussed with: CRNA  Anesthesia Plan Comments: (Lab Results      Component                Value               Date                      WBC                      4.9                 08/18/2021                HGB                      12.8                 08/18/2021                HCT                      38.8                08/18/2021                MCV                      99.5                08/18/2021                PLT  212                 08/18/2021           Lab Results      Component                Value               Date                      NA                       138                 08/18/2021                K                        3.7                 08/18/2021                CO2                      24                  08/18/2021                GLUCOSE                  110 (H)             08/18/2021                BUN                      15                  08/18/2021                CREATININE               0.78                08/18/2021                CALCIUM                  8.9                 08/18/2021                GFRNONAA                 >60                 08/18/2021          )       Anesthesia Quick Evaluation

## 2021-08-18 ENCOUNTER — Other Ambulatory Visit: Payer: Self-pay

## 2021-08-18 ENCOUNTER — Encounter (HOSPITAL_COMMUNITY): Payer: Self-pay | Admitting: Interventional Radiology

## 2021-08-18 ENCOUNTER — Inpatient Hospital Stay (HOSPITAL_COMMUNITY)
Admission: RE | Admit: 2021-08-18 | Discharge: 2021-08-18 | Disposition: A | Discharge status: Home / Self Care | Payer: Medicare HMO | Source: Ambulatory Visit | Attending: Interventional Radiology | Admitting: Interventional Radiology

## 2021-08-18 ENCOUNTER — Encounter (HOSPITAL_COMMUNITY)
Admission: RE | Disposition: A | Discharge status: Home / Self Care | Payer: Self-pay | Source: Home / Self Care | Attending: Interventional Radiology

## 2021-08-18 ENCOUNTER — Inpatient Hospital Stay (HOSPITAL_COMMUNITY): Payer: Medicare HMO | Admitting: Physician Assistant

## 2021-08-18 ENCOUNTER — Inpatient Hospital Stay (HOSPITAL_COMMUNITY)
Admission: RE | Admit: 2021-08-18 | Discharge: 2021-08-19 | DRG: 027 | Disposition: A | Discharge status: Home / Self Care | Payer: Medicare HMO | Attending: Interventional Radiology | Admitting: Interventional Radiology

## 2021-08-18 DIAGNOSIS — Z881 Allergy status to other antibiotic agents status: Secondary | ICD-10-CM

## 2021-08-18 DIAGNOSIS — Z7982 Long term (current) use of aspirin: Secondary | ICD-10-CM | POA: Diagnosis not present

## 2021-08-18 DIAGNOSIS — K219 Gastro-esophageal reflux disease without esophagitis: Secondary | ICD-10-CM | POA: Diagnosis present

## 2021-08-18 DIAGNOSIS — I671 Cerebral aneurysm, nonruptured: Secondary | ICD-10-CM

## 2021-08-18 DIAGNOSIS — Z8673 Personal history of transient ischemic attack (TIA), and cerebral infarction without residual deficits: Secondary | ICD-10-CM | POA: Diagnosis not present

## 2021-08-18 DIAGNOSIS — Z8249 Family history of ischemic heart disease and other diseases of the circulatory system: Secondary | ICD-10-CM

## 2021-08-18 DIAGNOSIS — Z8701 Personal history of pneumonia (recurrent): Secondary | ICD-10-CM

## 2021-08-18 DIAGNOSIS — Z87442 Personal history of urinary calculi: Secondary | ICD-10-CM

## 2021-08-18 DIAGNOSIS — M1990 Unspecified osteoarthritis, unspecified site: Secondary | ICD-10-CM | POA: Diagnosis present

## 2021-08-18 DIAGNOSIS — I341 Nonrheumatic mitral (valve) prolapse: Secondary | ICD-10-CM | POA: Diagnosis not present

## 2021-08-18 DIAGNOSIS — Z888 Allergy status to other drugs, medicaments and biological substances status: Secondary | ICD-10-CM

## 2021-08-18 DIAGNOSIS — J45909 Unspecified asthma, uncomplicated: Secondary | ICD-10-CM | POA: Diagnosis present

## 2021-08-18 DIAGNOSIS — Z981 Arthrodesis status: Secondary | ICD-10-CM

## 2021-08-18 DIAGNOSIS — Z91013 Allergy to seafood: Secondary | ICD-10-CM

## 2021-08-18 DIAGNOSIS — D649 Anemia, unspecified: Secondary | ICD-10-CM | POA: Diagnosis not present

## 2021-08-18 DIAGNOSIS — Z7902 Long term (current) use of antithrombotics/antiplatelets: Secondary | ICD-10-CM | POA: Diagnosis not present

## 2021-08-18 DIAGNOSIS — Z8616 Personal history of COVID-19: Secondary | ICD-10-CM

## 2021-08-18 DIAGNOSIS — Z79899 Other long term (current) drug therapy: Secondary | ICD-10-CM | POA: Diagnosis not present

## 2021-08-18 DIAGNOSIS — Z90710 Acquired absence of both cervix and uterus: Secondary | ICD-10-CM

## 2021-08-18 DIAGNOSIS — Z972 Presence of dental prosthetic device (complete) (partial): Secondary | ICD-10-CM | POA: Diagnosis not present

## 2021-08-18 DIAGNOSIS — Z9181 History of falling: Secondary | ICD-10-CM | POA: Diagnosis not present

## 2021-08-18 DIAGNOSIS — I6502 Occlusion and stenosis of left vertebral artery: Secondary | ICD-10-CM | POA: Diagnosis not present

## 2021-08-18 HISTORY — PX: IR TRANSCATH/EMBOLIZ: IMG695

## 2021-08-18 HISTORY — PX: IR ANGIO INTRA EXTRACRAN SEL INTERNAL CAROTID BILAT MOD SED: IMG5363

## 2021-08-18 HISTORY — PX: IR ANGIOGRAM FOLLOW UP STUDY: IMG697

## 2021-08-18 HISTORY — DX: Anemia, unspecified: D64.9

## 2021-08-18 HISTORY — PX: IR CT HEAD LTD: IMG2386

## 2021-08-18 HISTORY — PX: RADIOLOGY WITH ANESTHESIA: SHX6223

## 2021-08-18 HISTORY — DX: Cerebral infarction, unspecified: I63.9

## 2021-08-18 HISTORY — DX: Pneumonia, unspecified organism: J18.9

## 2021-08-18 HISTORY — PX: IR 3D INDEPENDENT WKST: IMG2385

## 2021-08-18 HISTORY — PX: IR US GUIDE VASC ACCESS RIGHT: IMG2390

## 2021-08-18 HISTORY — PX: IR ANGIO VERTEBRAL SEL SUBCLAVIAN INNOMINATE BILAT MOD SED: IMG5366

## 2021-08-18 LAB — CBC WITH DIFFERENTIAL/PLATELET
Abs Immature Granulocytes: 0.02 10*3/uL (ref 0.00–0.07)
Basophils Absolute: 0.1 10*3/uL (ref 0.0–0.1)
Basophils Relative: 1 %
Eosinophils Absolute: 0.2 10*3/uL (ref 0.0–0.5)
Eosinophils Relative: 4 %
HCT: 38.8 % (ref 36.0–46.0)
Hemoglobin: 12.8 g/dL (ref 12.0–15.0)
Immature Granulocytes: 0 %
Lymphocytes Relative: 35 %
Lymphs Abs: 1.7 10*3/uL (ref 0.7–4.0)
MCH: 32.8 pg (ref 26.0–34.0)
MCHC: 33 g/dL (ref 30.0–36.0)
MCV: 99.5 fL (ref 80.0–100.0)
Monocytes Absolute: 0.7 10*3/uL (ref 0.1–1.0)
Monocytes Relative: 15 %
Neutro Abs: 2.2 10*3/uL (ref 1.7–7.7)
Neutrophils Relative %: 45 %
Platelets: 212 10*3/uL (ref 150–400)
RBC: 3.9 MIL/uL (ref 3.87–5.11)
RDW: 13.6 % (ref 11.5–15.5)
WBC: 4.9 10*3/uL (ref 4.0–10.5)
nRBC: 0 % (ref 0.0–0.2)

## 2021-08-18 LAB — BASIC METABOLIC PANEL
Anion gap: 10 (ref 5–15)
BUN: 15 mg/dL (ref 8–23)
CO2: 24 mmol/L (ref 22–32)
Calcium: 8.9 mg/dL (ref 8.9–10.3)
Chloride: 104 mmol/L (ref 98–111)
Creatinine, Ser: 0.78 mg/dL (ref 0.44–1.00)
GFR, Estimated: >60 mL/min (ref >60)
Glucose, Bld: 110 mg/dL — ABNORMAL HIGH (ref 70–99)
Potassium: 3.7 mmol/L (ref 3.5–5.1)
Sodium: 138 mmol/L (ref 135–145)

## 2021-08-18 LAB — POCT ACTIVATED CLOTTING TIME
Activated Clotting Time: 149 seconds
Activated Clotting Time: 227 seconds
Activated Clotting Time: 281 seconds

## 2021-08-18 LAB — PROTIME-INR
INR: 0.8 (ref 0.8–1.2)
Prothrombin Time: 11.6 seconds (ref 11.4–15.2)

## 2021-08-18 LAB — MRSA NEXT GEN BY PCR, NASAL: MRSA by PCR Next Gen: NOT DETECTED

## 2021-08-18 LAB — PREPARE RBC (CROSSMATCH)

## 2021-08-18 LAB — HEPARIN LEVEL (UNFRACTIONATED): Heparin Unfractionated: 0.63 IU/mL (ref 0.30–0.70)

## 2021-08-18 LAB — PLATELET INHIBITION P2Y12

## 2021-08-18 SURGERY — IR WITH ANESTHESIA
Anesthesia: General

## 2021-08-18 MED ORDER — ALBUTEROL SULFATE (2.5 MG/3ML) 0.083% IN NEBU: 3.0000 mL | INHALATION_SOLUTION | RESPIRATORY_TRACT | Status: DC | PRN

## 2021-08-18 MED ORDER — FENTANYL CITRATE (PF) 250 MCG/5ML IJ SOLN
INTRAMUSCULAR | Status: DC | PRN
  Administered 2021-08-18 (×2): 50 ug via INTRAVENOUS

## 2021-08-18 MED ORDER — ATORVASTATIN CALCIUM 10 MG PO TABS
20.0000 mg | ORAL_TABLET | Freq: Every day | ORAL | Status: DC
  Administered 2021-08-19: 20 mg via ORAL
  Filled 2021-08-18: qty 2

## 2021-08-18 MED ORDER — ASPIRIN EC 325 MG PO TBEC: 325.0000 mg | DELAYED_RELEASE_TABLET | ORAL | Status: DC

## 2021-08-18 MED ORDER — ACETAMINOPHEN 160 MG/5ML PO SOLN: 650.0000 mg | ORAL | Status: DC | PRN

## 2021-08-18 MED ORDER — FENTANYL CITRATE (PF) 100 MCG/2ML IJ SOLN: 25.0000 ug | INTRAMUSCULAR | Status: DC | PRN

## 2021-08-18 MED ORDER — NITROGLYCERIN 1 MG/10 ML FOR IR/CATH LAB
INTRA_ARTERIAL | Status: AC
  Filled 2021-08-18: qty 10

## 2021-08-18 MED ORDER — VANCOMYCIN HCL 1000 MG IV SOLR
1000.0000 mg | INTRAVENOUS | Status: AC
  Administered 2021-08-18: 1000 mg via INTRAVENOUS

## 2021-08-18 MED ORDER — CLOPIDOGREL BISULFATE 75 MG PO TABS
75.0000 mg | ORAL_TABLET | Freq: Every day | ORAL | Status: DC
  Administered 2021-08-19: 75 mg via ORAL
  Filled 2021-08-18: qty 1

## 2021-08-18 MED ORDER — DICLOFENAC SODIUM 1 % EX GEL: 2.0000 g | Freq: Every day | CUTANEOUS | Status: DC | PRN

## 2021-08-18 MED ORDER — CLEVIDIPINE BUTYRATE 0.5 MG/ML IV EMUL
INTRAVENOUS | Status: DC | PRN
Start: 2021-08-18 — End: 2021-08-18
  Administered 2021-08-18: 4 mg/h via INTRAVENOUS

## 2021-08-18 MED ORDER — NAPROXEN 250 MG PO TABS
250.0000 mg | ORAL_TABLET | Freq: Every evening | ORAL | Status: DC | PRN
  Filled 2021-08-18: qty 1

## 2021-08-18 MED ORDER — ACETAMINOPHEN 650 MG RE SUPP: 650.0000 mg | RECTAL | Status: DC | PRN

## 2021-08-18 MED ORDER — HEPARIN (PORCINE) 25000 UT/250ML-% IV SOLN
INTRAVENOUS | Status: AC
  Filled 2021-08-18: qty 250

## 2021-08-18 MED ORDER — FENTANYL CITRATE (PF) 100 MCG/2ML IJ SOLN
INTRAMUSCULAR | Status: AC
  Filled 2021-08-18: qty 2

## 2021-08-18 MED ORDER — POLYVINYL ALCOHOL 1.4 % OP SOLN
1.0000 [drp] | OPHTHALMIC | Status: DC | PRN
  Administered 2021-08-18: 1 [drp] via OPHTHALMIC
  Filled 2021-08-18: qty 15

## 2021-08-18 MED ORDER — IOHEXOL 300 MG/ML  SOLN
100.0000 mL | Freq: Once | INTRAMUSCULAR | Status: AC | PRN
  Administered 2021-08-18: 60 mL via INTRA_ARTERIAL

## 2021-08-18 MED ORDER — EPTIFIBATIDE 20 MG/10ML IV SOLN
INTRAVENOUS | Status: AC
  Filled 2021-08-18: qty 10

## 2021-08-18 MED ORDER — VERAPAMIL HCL 2.5 MG/ML IV SOLN
INTRAVENOUS | Status: AC
  Filled 2021-08-18: qty 2

## 2021-08-18 MED ORDER — CHLORHEXIDINE GLUCONATE 0.12 % MT SOLN
OROMUCOSAL | Status: AC
  Administered 2021-08-18: 15 mL via OROMUCOSAL
  Filled 2021-08-18: qty 15

## 2021-08-18 MED ORDER — PROTAMINE SULFATE 10 MG/ML IV SOLN
INTRAVENOUS | Status: DC | PRN
  Administered 2021-08-18: 5 mg via INTRAVENOUS

## 2021-08-18 MED ORDER — VITAMIN B-12 1000 MCG PO TABS
1000.0000 ug | ORAL_TABLET | Freq: Every day | ORAL | Status: DC
  Administered 2021-08-19: 1000 ug via ORAL
  Filled 2021-08-18: qty 1

## 2021-08-18 MED ORDER — PHENYLEPHRINE 40 MCG/ML (10ML) SYRINGE FOR IV PUSH (FOR BLOOD PRESSURE SUPPORT)
PREFILLED_SYRINGE | INTRAVENOUS | Status: DC | PRN
  Administered 2021-08-18: 80 ug via INTRAVENOUS

## 2021-08-18 MED ORDER — LACTATED RINGERS IV SOLN: INTRAVENOUS | Status: DC

## 2021-08-18 MED ORDER — HEPARIN SODIUM (PORCINE) 1000 UNIT/ML IJ SOLN
INTRAMUSCULAR | Status: DC | PRN
  Administered 2021-08-18: 1000 [IU] via INTRAVENOUS
  Administered 2021-08-18: 3000 [IU] via INTRAVENOUS
  Administered 2021-08-18: 1000 [IU] via INTRAVENOUS

## 2021-08-18 MED ORDER — ORAL CARE MOUTH RINSE: 15.0000 mL | Freq: Once | OROMUCOSAL | Status: AC

## 2021-08-18 MED ORDER — ACETAMINOPHEN 10 MG/ML IV SOLN: 1000.0000 mg | Freq: Once | INTRAVENOUS | Status: DC | PRN

## 2021-08-18 MED ORDER — B COMPLEX-C PO TABS
1.0000 | ORAL_TABLET | Freq: Every day | ORAL | Status: DC
  Administered 2021-08-19: 1 via ORAL
  Filled 2021-08-18: qty 1

## 2021-08-18 MED ORDER — CLEVIDIPINE BUTYRATE 0.5 MG/ML IV EMUL
INTRAVENOUS | Status: AC
  Filled 2021-08-18: qty 50

## 2021-08-18 MED ORDER — IOHEXOL 300 MG/ML  SOLN
100.0000 mL | Freq: Once | INTRAMUSCULAR | Status: AC | PRN
  Administered 2021-08-18: 65 mL via INTRA_ARTERIAL

## 2021-08-18 MED ORDER — CHLORHEXIDINE GLUCONATE 0.12 % MT SOLN: 15.0000 mL | Freq: Once | OROMUCOSAL | Status: AC

## 2021-08-18 MED ORDER — ONDANSETRON HCL 4 MG/2ML IJ SOLN
INTRAMUSCULAR | Status: DC | PRN
  Administered 2021-08-18: 4 mg via INTRAVENOUS

## 2021-08-18 MED ORDER — ROCURONIUM BROMIDE 10 MG/ML (PF) SYRINGE
PREFILLED_SYRINGE | INTRAVENOUS | Status: DC | PRN
  Administered 2021-08-18: 50 mg via INTRAVENOUS
  Administered 2021-08-18: 40 mg via INTRAVENOUS
  Administered 2021-08-18: 50 mg via INTRAVENOUS

## 2021-08-18 MED ORDER — EPTIFIBATIDE 20 MG/10ML IV SOLN
INTRAVENOUS | Status: AC | PRN
Start: 2021-08-18 — End: 2021-08-18
  Administered 2021-08-18: 1 mL via INTRA_ARTERIAL

## 2021-08-18 MED ORDER — SUGAMMADEX SODIUM 200 MG/2ML IV SOLN
INTRAVENOUS | Status: DC | PRN
  Administered 2021-08-18: 300 mg via INTRAVENOUS

## 2021-08-18 MED ORDER — HEPARIN SODIUM (PORCINE) 1000 UNIT/ML IJ SOLN
INTRAMUSCULAR | Status: AC
  Filled 2021-08-18: qty 10

## 2021-08-18 MED ORDER — VANCOMYCIN HCL IN DEXTROSE 1-5 GM/200ML-% IV SOLN
INTRAVENOUS | Status: AC
  Administered 2021-08-18: 1000 mg
  Filled 2021-08-18: qty 200

## 2021-08-18 MED ORDER — BLACK PEPPER-TURMERIC 3-500 MG PO CAPS: 1500.0000 mg | ORAL_CAPSULE | Freq: Every day | ORAL | Status: DC

## 2021-08-18 MED ORDER — PHENYLEPHRINE HCL-NACL 20-0.9 MG/250ML-% IV SOLN
INTRAVENOUS | Status: DC | PRN
  Administered 2021-08-18: 30 ug/min via INTRAVENOUS

## 2021-08-18 MED ORDER — ASPIRIN 81 MG PO CHEW
81.0000 mg | CHEWABLE_TABLET | Freq: Every day | ORAL | Status: DC
  Administered 2021-08-19: 81 mg via ORAL
  Filled 2021-08-18: qty 1

## 2021-08-18 MED ORDER — CHLORHEXIDINE GLUCONATE CLOTH 2 % EX PADS: 6.0000 | MEDICATED_PAD | Freq: Every day | CUTANEOUS | Status: DC

## 2021-08-18 MED ORDER — CLEVIDIPINE BUTYRATE 0.5 MG/ML IV EMUL: 0.0000 mg/h | INTRAVENOUS | Status: DC

## 2021-08-18 MED ORDER — DEXAMETHASONE SODIUM PHOSPHATE 10 MG/ML IJ SOLN
INTRAMUSCULAR | Status: DC | PRN
  Administered 2021-08-18: 8 mg via INTRAVENOUS

## 2021-08-18 MED ORDER — SODIUM CHLORIDE 0.9 % IV SOLN: INTRAVENOUS | Status: DC

## 2021-08-18 MED ORDER — HEPARIN (PORCINE) 25000 UT/250ML-% IV SOLN
500.0000 [IU]/h | INTRAVENOUS | Status: DC
  Administered 2021-08-18: 500 [IU]/h via INTRAVENOUS

## 2021-08-18 MED ORDER — SODIUM CHLORIDE 0.9% IV SOLUTION: Freq: Once | INTRAVENOUS | Status: DC

## 2021-08-18 MED ORDER — ACETAMINOPHEN 325 MG PO TABS
650.0000 mg | ORAL_TABLET | ORAL | Status: DC | PRN
  Administered 2021-08-18: 650 mg via ORAL
  Filled 2021-08-18: qty 2

## 2021-08-18 MED ORDER — NIMODIPINE 30 MG PO CAPS: 0.0000 mg | ORAL_CAPSULE | ORAL | Status: DC

## 2021-08-18 MED ORDER — CLOPIDOGREL BISULFATE 75 MG PO TABS
75.0000 mg | ORAL_TABLET | ORAL | Status: AC
  Administered 2021-08-18: 75 mg via ORAL
  Filled 2021-08-18: qty 1

## 2021-08-18 MED ORDER — LIDOCAINE HCL 1 % IJ SOLN
INTRAMUSCULAR | Status: AC
  Filled 2021-08-18: qty 20

## 2021-08-18 MED ORDER — CLOPIDOGREL BISULFATE 75 MG PO TABS: 75.0000 mg | ORAL_TABLET | Freq: Every day | ORAL | Status: DC

## 2021-08-18 MED ORDER — LIDOCAINE 2% (20 MG/ML) 5 ML SYRINGE
INTRAMUSCULAR | Status: DC | PRN
  Administered 2021-08-18: 40 mg via INTRAVENOUS

## 2021-08-18 MED ORDER — ASPIRIN 81 MG PO CHEW
CHEWABLE_TABLET | ORAL | Status: AC
  Administered 2021-08-18: 81 mg
  Filled 2021-08-18: qty 1

## 2021-08-18 MED ORDER — HEPARIN (PORCINE) 25000 UT/250ML-% IV SOLN: 400.0000 [IU]/h | INTRAVENOUS | Status: AC

## 2021-08-18 MED ORDER — EPTIFIBATIDE 20 MG/10ML IV SOLN
INTRAVENOUS | Status: AC | PRN
  Administered 2021-08-18: 1 mL via INTRA_ARTERIAL

## 2021-08-18 MED ORDER — ASPIRIN 81 MG PO CHEW: 81.0000 mg | CHEWABLE_TABLET | Freq: Every day | ORAL | Status: DC

## 2021-08-18 MED ORDER — VITAMIN D 25 MCG (1000 UNIT) PO TABS
1000.0000 [IU] | ORAL_TABLET | Freq: Every day | ORAL | Status: DC
  Administered 2021-08-19: 1000 [IU] via ORAL
  Filled 2021-08-18: qty 1

## 2021-08-18 MED ORDER — PROPOFOL 10 MG/ML IV BOLUS
INTRAVENOUS | Status: DC | PRN
Start: 2021-08-18 — End: 2021-08-18
  Administered 2021-08-18: 40 mg via INTRAVENOUS
  Administered 2021-08-18: 80 mg via INTRAVENOUS

## 2021-08-18 NOTE — Sedation Documentation (Signed)
Sheath pulled and 8 fr angioseal depolyed at 1233 to the right groin. Gauze and tegaderm applied- dressing clean/dry/intact.   Gauze and tegederm to right wrist due to multiple attempts to access radial. Site level 0 with dressing clean/dry/intact.  Handoff with PACU.  Sites level 0 with dressing clean/dry/intact with pulses same.

## 2021-08-18 NOTE — Procedures (Signed)
INR.  S/P  4 vessel cerebral arteriogram. RT CFA approach. Findings. Approx 6.52mmx  6.99mm AP and 5.10mmx 5.10mm on lateral DSA. S/P placement of a 7 3 WEB SL .  Post CT No ICH  18F angioseal for closure of RT CFA site. Distal pulses intact. Extubated. Denies any H/As,N/V or speech difficulties. Follows instructions appropriately. Pupils 2 to 66mm RT = LT sluggish. No facial asymmetry. Tongue midline. Moves all 4s equally. Distal, pulses intact. S.Harnoor Reta MD

## 2021-08-18 NOTE — Progress Notes (Signed)
ANTICOAGULATION CONSULT NOTE - Initial Consult  Pharmacy Consult:  Heparin Indication: Post interventional neuroradiology procedure  Allergies  Allergen Reactions   Amitriptyline Hcl     Elevation in muscle enzymes   Clindamycin/Lincomycin     Causes esophagitis   Zithromax [Azithromycin] Rash   Capsaicin     Pt does not remember    Ceclor [Cefaclor] Nausea Only   Shellfish Allergy Rash    Lobster Only - made pt sick, caused blisters      Patient Measurements: Height: 4\' 11"  (149.9 cm) Weight: 63.5 kg (140 lb) IBW/kg (Calculated) : 43.2 Heparin Dosing Weight: 59 kg  Vital Signs: Temp: 97.4 F (36.3 C) (01/30 1340) Temp Source: Oral (01/30 8341) BP: 96/54 (01/30 1340) Pulse Rate: 68 (01/30 1345)  Labs: Recent Labs    08/18/21 0719  HGB 12.8  HCT 38.8  PLT 212  LABPROT 11.6  INR 0.8  CREATININE 0.78    Estimated Creatinine Clearance: 40.1 mL/min (by C-G formula based on SCr of 0.78 mg/dL).   Medical History: Past Medical History:  Diagnosis Date   Anemia    Arthritis    Degenerative   Asthma    Cervical spondylosis    Cervicogenic headache    COVID 2020   Gastroesophageal reflux disease    Headache    History of renal calculi    Memory difficulties 02/10/2016   Mitral valve prolapse    Pneumonia    Stroke [facility])    seen on MRI    Assessment: 89 YOM to continue on IV heparin post IR procedure.  Baseline labs reviewed.  Goal of Therapy:  Heparin level 0.1-0.25 units/ml Monitor platelets by anticoagulation protocol: Yes   Plan:  Continue heparin gtt at 500 units/hr - stop in AM per protocol Check 8 hr heparin level  Lurlean Kernen D. Mina Marble, PharmD, BCPS, [place] 08/18/2021, 2:13 PM

## 2021-08-18 NOTE — Progress Notes (Signed)
°  Pt transferred to Neuro ICU s/p 4 vessel arteriogram with right CFA approach with WEB SL placement by S. Deveshwar, MD.    Alert, aware and oriented X 3 Speech and comprehension is intact.  Comprehension and fluency are normal.  Judgment and insight normal PERRLA bilaterally No facial droop noted Tongue midline Can spontaneously move all 4 extremities. Hand grip strength equal bilaterally. Dorsiflexion 5/5 bilaterally. Plantar flexion 5/5 bilaterally.  Negative pronator drift Fine motor and coordination intact.  Gait not assessed Romberg not assessed Heel to toe not assessed  Pt c/o mild HA, 4/10 R groin site is soft with no appreciable pseudoaneurysm. Pt denies pain/tenderness to R groin site.  Two small areas of light pink tinged spots noted on bandage with no active bleeding at R groin.  Distal pulse palpable on operative R lower extremity     Narda Rutherford, AGNP-BC 08/18/2021, 1:46 PM

## 2021-08-18 NOTE — Anesthesia Procedure Notes (Signed)
Arterial Line Insertion Start/End1/30/2023 7:50 AM, 08/18/2021 7:56 AM Performed by: Inda Coke, CRNA, CRNA  Preanesthetic checklist: patient identified, IV checked, site marked, risks and benefits discussed, surgical consent, monitors and equipment checked, pre-op evaluation, timeout performed and anesthesia consent Lidocaine 1% used for infiltration Left, radial was placed Catheter size: 20 G Hand hygiene performed  and maximum sterile barriers used  Allen's test indicative of satisfactory collateral circulation Attempts: 1 Procedure performed without using ultrasound guided technique. Following insertion, dressing applied and Biopatch. Post procedure assessment: normal  Patient tolerated the procedure well with no immediate complications.

## 2021-08-18 NOTE — Progress Notes (Signed)
ANTICOAGULATION CONSULT NOTE - Initial Consult  Pharmacy Consult:  Heparin Indication: Post interventional neuroradiology procedure  Allergies  Allergen Reactions   Amitriptyline Hcl     Elevation in muscle enzymes   Clindamycin/Lincomycin     Causes esophagitis   Zithromax [Azithromycin] Rash   Capsaicin     Pt does not remember    Ceclor [Cefaclor] Nausea Only   Shellfish Allergy Rash    Lobster Only - made pt sick, caused blisters      Patient Measurements: Height: 4\' 11"  (149.9 cm) Weight: 63.5 kg (140 lb) IBW/kg (Calculated) : 43.2 Heparin Dosing Weight: 59 kg  Vital Signs: Temp: 97.7 F (36.5 C) (01/30 2000) Temp Source: Oral (01/30 2000) BP: 96/76 (01/30 2100) Pulse Rate: 85 (01/30 2100)  Labs: Recent Labs    08/18/21 0719 08/18/21 2136  HGB 12.8  --   HCT 38.8  --   PLT 212  --   LABPROT 11.6  --   INR 0.8  --   HEPARINUNFRC  --  0.63  CREATININE 0.78  --     Estimated Creatinine Clearance: 40.1 mL/min (by C-G formula based on SCr of 0.78 mg/dL).   Medical History: Past Medical History:  Diagnosis Date   Anemia    Arthritis    Degenerative   Asthma    Cervical spondylosis    Cervicogenic headache    COVID 2020   Gastroesophageal reflux disease    Headache    History of renal calculi    Memory difficulties 02/10/2016   Mitral valve prolapse    Pneumonia    Stroke [facility])    seen on MRI    Assessment: 89 YOM to continue on IV heparin post IR procedure.    Heparin level 0.63 is supratherapeutic on 500 units/hr, confirmed heparin rate with RN. HL Drawn from R hand, heparin is running in L arm. Heparin was held for about 1 hour at 1720 for oozing from procedure site.  No issues with infusion or bleeding per RN since then. Unclear why HL is so high on a low rate of heparin.  Goal of Therapy:  Heparin level 0.1-0.25 units/ml Monitor platelets by anticoagulation protocol: Yes   Plan:  Decrease heparin gtt to 400 units/hr - stop at 8 AM per  protocol F/u AM HL and bleeding   Benetta Spar, PharmD, BCPS, [facility] Clinical Pharmacist  Please check AMION for all [place] phone numbers After 10:00 PM, call [place] [phone]

## 2021-08-18 NOTE — Transfer of Care (Signed)
Immediate Anesthesia Transfer of Care Note  Patient: Theresa [name]  Procedure(s) Performed: EMBOLIZATION IR TRANSCATH/EMBOLIZ IR CT HEAD LTD IR 3D INDEPENDENT WKST IR US GUIDE VASC ACCESS RIGHT IR ANGIOGRAM FOLLOW UP STUDY IR ANGIO INTRA EXTRACRAN SEL INTERNAL CAROTID BILAT MOD SED IR ANGIO VERTEBRAL SEL SUBCLAVIAN INNOMINATE BILAT MOD SED  Patient Location: PACU  Anesthesia Type:General  Level of Consciousness: awake and alert   Airway & Oxygen Therapy: Patient Spontanous Breathing and Patient connected to nasal cannula oxygen  Post-op Assessment: Report given to RN, Post -op Vital signs reviewed and stable, Patient moving all extremities X 4 and Patient able to stick tongue midline  Post vital signs: Reviewed and stable, IBP 133/91, HR 72, Pox 97%  Last Vitals:  Vitals Value Taken Time  BP 105/51 08/18/21 1311  Temp    Pulse 67 08/18/21 1320  Resp 15 08/18/21 1320  SpO2 97 % 08/18/21 1320  Vitals shown include unvalidated device data.  Last Pain:  Vitals:   08/18/21 0637  TempSrc:   PainSc: 0-No pain         Complications: No notable events documented.

## 2021-08-18 NOTE — Progress Notes (Signed)
S. Deveshwar in room to round on patient. RN made MD aware that art line very positional and cuff lower than art line.   Orders for SBP 120-140 by art line or 100-140 by cuff. Per MD, may remove art line if continuously positional.

## 2021-08-18 NOTE — Progress Notes (Addendum)
Pt started bleeding significantly from groin site at 1717-pressure initiated to right groin. Pt lying flat. Dorsalis pedal pulses intact.  Called MD Deveshwar at 1718 to inform.   Verbal orders from [place]:  Lay patient flat (no reverse trendelenburg) x4 hour, stop heparin x1 hour, hold pressure x 20 minutes, place knee immobilizer.  Heparin turned off at 1719.  1735 MD Deveshwar to bedside, dressing changed to quickclot @1740 .

## 2021-08-18 NOTE — Anesthesia Procedure Notes (Signed)
Procedure Name: Intubation Date/Time: 08/18/2021 8:54 AM Performed by: Inda Coke, CRNA Pre-anesthesia Checklist: Patient identified, Emergency Drugs available, Suction available and Patient being monitored Patient Re-evaluated:Patient Re-evaluated prior to induction Oxygen Delivery Method: Circle System Utilized Preoxygenation: Pre-oxygenation with 100% oxygen Induction Type: IV induction Ventilation: Mask ventilation without difficulty Laryngoscope Size: Glidescope and 3 Grade View: Grade I Tube type: Oral Tube size: 7.0 mm Number of attempts: 1 Airway Equipment and Method: Rigid stylet and Video-laryngoscopy Placement Confirmation: ETT inserted through vocal cords under direct vision, positive ETCO2 and breath sounds checked- equal and bilateral Secured at: 20 cm Tube secured with: Tape Dental Injury: Teeth and Oropharynx as per pre-operative assessment  Difficulty Due To: Difficulty was anticipated and Difficult Airway- due to reduced neck mobility Comments: Elective glidescope intubation

## 2021-08-18 NOTE — Progress Notes (Signed)
Orthopedic Tech Progress Note Patient Details:  Theresa [name] Oct 28, 1932 [id number]  Ortho Devices Type of Ortho Device: Knee Immobilizer Ortho Device/Splint Location: RLE Ortho Device/Splint Interventions: Ordered, Application, Adjustment   Post Interventions Patient Tolerated: Well Instructions Provided: Care of device  Janit Pagan 08/18/2021, 6:29 PM

## 2021-08-18 NOTE — Progress Notes (Signed)
Belongings arrived from PACU including clothing, purse, phone, upper dentures, lower partial plate.

## 2021-08-18 NOTE — Consult Note (Addendum)
Chief Complaint: Patient was seen in consultation today for Cerebral arteriogram with possible anterior communicating artery aneurysm embolization at the request of Dr Deland Pretty   Supervising Physician: Luanne Bras  Patient Status: [facility] - Out-pt  History of Present Illness: Theresa [name] is a 88 y.o. female   Hx headaches-- slightly worse over last 6-7 mo Denies N/V; speech or vision changes  Pt fell at home 06/10/21 and PCP ordered MR for evaluation Patient underwent an MRI of the brain, and an MRI of the cervical spine. The MRI of the brain revealed an approximately 6 mm well-defined cystic lesion in the right cerebellar vermis region. CSF signal on all the MRI sequences. 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 The cervical spine MRI depicted sequela of a previous surgical fusion at C4-7 and C4-T2.  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 Discussed was the natural history of unruptured brain aneurysms. The risk of rupture of 1-2% per year with attendant significant mortality and morbidity were reviewed. Increased risk of rupture were those of female gender, hypertension, smoking, and strong family history of brain rupture for unruptured aneurysm. She notes the aneurysm was apparently seen on a previous MRA examination some years ago. She reports the CD is probably with Dr. Georgie Chard office. Management considerations were those of continued surveillance with follow-up MRA studies versus consideration of a elimination of the aneurysm from circulation via endovascular treatment. Treatment options were of primary coiling versus stent assisted coiling versus the use of an intra saccular flow disruption device were discussed. The procedure,, including the risks of an ischemic event  with remote possibility of intraprocedural rupture leading to death were reviewed. The endovascular procedure would be under general anesthesia with the patient having to spend overnight in the ICU. Patient expressed understanding and would like to proceed with endovascular treatment. This will be scheduled at the earliest possible. Patient will be started on aspirin 81 mg a day, and Plavix 75 mg a day at least 7-10 days prior to the procedure.  Now scheduled for cerebral arteriogram with possible anterior communicating artery aneurysm embolization. Taking ASA 81 and Plavix 75 mg daily   Past Medical History:  Diagnosis Date   Anemia    Arthritis    Degenerative   Asthma    Cervical spondylosis    Cervicogenic headache    COVID 2020   Gastroesophageal reflux disease    Headache    History of renal calculi    Memory difficulties 02/10/2016   Mitral valve prolapse    Pneumonia    Stroke [facility])    seen on MRI    Past Surgical History:  Procedure Laterality Date   ABDOMINAL HYSTERECTOMY     BUNIONECTOMY Right    CATARACT EXTRACTION     CERVICAL SPINE SURGERY     On 3 occasions   CHOLECYSTECTOMY     ELBOW SURGERY     For bone spur   IR RADIOLOGIST EVAL & MGMT  07/28/2021    Allergies: Amitriptyline hcl, Clindamycin/lincomycin, Zithromax [azithromycin], Capsaicin, Ceclor [cefaclor], and Shellfish allergy  Medications: Prior to Admission medications   Medication Sig Start Date End Date Taking? Authorizing Provider  albuterol (VENTOLIN HFA) 108 (90 Base) MCG/ACT inhaler Inhale 1-2 puffs into the lungs every 4 (four) hours as needed for wheezing or shortness of breath. 07/24/21  Yes [provider]  aspirin 81 MG EC tablet Take 81 mg by mouth daily.   Yes [provider]  atorvastatin (LIPITOR) 20 MG tablet Take 20 mg by mouth daily. 06/02/21  Yes [provider]  b complex vitamins capsule Take 1 capsule by mouth daily.   Yes [provider]  Black Pepper-Turmeric 3-500 MG CAPS Take 1,500 mg by mouth daily.   Yes [provider]  Cholecalciferol 25 MCG (1000 UT) tablet Take 1,000 Units by mouth daily.   Yes [provider]  clopidogrel (PLAVIX) 75 MG tablet Take 1 tablet (75 mg total) by mouth daily. 08/08/21  Yes Tyson Alias, NP  denosumab (PROLIA) 60 MG/ML SOSY injection Inject 60 mg into the skin every 6 (six) months.   Yes [provider]  diclofenac Sodium (VOLTAREN) 1 % GEL 1 application daily as needed (pain). 01/02/20  Yes [provider]  famotidine (PEPCID) 40 MG tablet Take 40 mg by mouth daily as needed for heartburn. 04/24/20  Yes [provider]  Glucos-Chondroit-Hyaluron-MSM (GLUCOSAMINE CHONDROITIN JOINT PO) Take 30 mLs by mouth daily.   Yes [provider]  naproxen sodium (ALEVE) 220 MG tablet Take 220 mg by mouth at bedtime as needed (pain).   Yes [provider]  vitamin B-12 (CYANOCOBALAMIN) 1000 MCG tablet Take 1,000 mcg by mouth daily.   Yes [provider]     Family History  Problem Relation Age of Onset   Heart attack Father    Heart disease Sister    Cancer Sister    Breast cancer Sister    Coronary artery disease Brother    Diabetes Sister    Alzheimer's disease Brother    Diabetes Brother     Social History   Socioeconomic History   Marital status: Divorced    Spouse name: Not on file   Number of children: 2   Years of education: Some col   Highest education level: Some college, no degree  Occupational History   Occupation: Retired  Tobacco Use   Smoking status: Never   Smokeless tobacco: Never  Vaping Use   Vaping Use: Never used  Substance and Sexual Activity   Alcohol use: Not Currently    Comment: drinks alcohol on occasion   Drug use: Never   Sexual activity: Not on file  Other Topics Concern   Not on file  Social History Narrative   Lives at home alone.   Right-handed.   2-3 cups caffeine per day.    Social Determinants of Health   Financial Resource Strain: Not on file  Food Insecurity: Not on file  Transportation Needs: Not on file  Physical Activity: Not on file  Stress: Not on file  Social Connections: Not on file    Review of Systems: A 12 point ROS discussed and pertinent positives are indicated in the HPI above.  All other systems are negative.  Review of Systems  Constitutional:  Negative for activity change, appetite change, fatigue, fever and unexpected weight change.  HENT:  Negative for hearing loss, tinnitus and trouble swallowing.   Eyes:  Negative for visual disturbance.  Respiratory:  Negative for cough and shortness of breath.   Cardiovascular:  Negative for chest pain.  Gastrointestinal:  Negative for abdominal pain, nausea and vomiting.  Musculoskeletal:  Negative for gait problem and neck pain.  Neurological:  Positive for headaches. Negative for dizziness, tremors, seizures, syncope, facial asymmetry, speech difficulty, weakness, light-headedness and numbness.  Psychiatric/Behavioral:  Negative  for behavioral problems and confusion.    Vital Signs: BP (!) 111/50    Pulse 74    Temp 97.6 F (36.4 C) (Oral)    Resp 17    Ht 4\' 11"  (1.499 m)    Wt 140 lb (63.5 kg)    SpO2 96%    BMI 28.28 kg/m   Physical Exam Vitals reviewed.  HENT:     Mouth/Throat:     Mouth: Mucous membranes are moist.  Eyes:     Extraocular Movements: Extraocular movements intact.  Cardiovascular:     Rate and Rhythm: Normal rate and regular rhythm.     Heart sounds: Normal heart sounds.  Pulmonary:     Effort: Pulmonary effort is normal.     Breath sounds: Normal breath sounds.  Abdominal:     Palpations: Abdomen is soft.     Tenderness: There is no abdominal tenderness.  Musculoskeletal:        General: No swelling. Normal range of motion.     Right lower leg: No edema.     Left lower leg: No edema.  Skin:    General: Skin is warm.  Neurological:     Mental Status:  She is alert and oriented to person, place, and time.  Psychiatric:        Mood and Affect: Mood normal.        Behavior: Behavior normal.        Thought Content: Thought content normal.        Judgment: Judgment normal.    Imaging: IR Radiologist Eval & Mgmt  Result Date: 07/28/2021 EXAM: NEW PATIENT OFFICE VISIT CHIEF COMPLAINT: Headaches. History of falls. Discovery of intracranial brain aneurysm. Current Pain Level: 1-10 HISTORY OF PRESENT ILLNESS: Patient is an 88 year old right handed lady referred for evaluation and management of a recently discovered anterior communicating artery region aneurysm on MRA examination. Patient reports a history of headaches which are different from her usual around the middle headaches. She describes these headaches as first being noticed about 6-7 months ago. These headaches are usually in the frontal parietal regions, sometimes over the vertex. They appear to be more prominent in the early morning waking the patient up. It may last for 15-20 minutes. There is occasional associated photophobia but no diplopia, blindness, or blurred vision. Headaches may be sharp or constant dull ache in character. Frequency of headaches is 4 to 5 a day. There is no associated speech difficulties motor weakness, nausea or vomiting. Patient denied any episodes of loss of awareness, or of seizure-like activity. The patient also describes episodes of falling which she primarily attributes to tripping most of the time. She denies any proceeding warnings signs, or of complete loss of awareness or consciousness. On occasions she has fallen on her head most recent being around November 7 of last year. She denies any proceeding chest pain, shortness of breath or palpitations. Patient underwent an MRI of the brain on 06/03/2021, and an MRI of the cervical spine. The MRI of the brain revealed an approximately 6 mm well-defined cystic lesion in the right cerebellar vermis region. CSF signal on  all the MRI sequences. 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 The cervical spine MRI depicted sequela of a previous surgical fusion at C4-7 and C4-T2. She denies any difficulty with breathing, shortness of breath, wheezing, cough or hemoptysis. Her weight is steady. Appetite is normal. Denies any difficulty swallowing liquids or solids, or of abdominal pain or constipation,  diarrhea or melena. She denies any symptoms of dysuria, hematuria, or of polyuria. She denies any recent chills, fever or rigors. The patient lives by herself and is fully independent taking care of her daily routine activities including shopping and driving. PAST MEDICAL HISTORY: Past Medical History: Diagnosis * : Date . * : Arthritis * : * : Degenerative . * : Asthma * : . * : Cervical spondylosis * : . * : Cervicogenic headache * : . * : Gastroesophageal reflux disease * : . * : Headache * : . * : History of renal calculi * : . * : Memory difficulties * : 02/10/2016 . * : Mitral valve prolapse * : MEDICATIONS: Current Outpatient Medications on File Prior to Visit Medication * : Sig * : Dispense * : Refill . * : aspirin 81 MG EC tablet * : aspirin 81 mg tablet,delayed release Take 1 tablet every day by oral route. * : * : . * : Baclofen 5 MG TABS * : * : * : . * : calcium carbonate (OS-CAL) 1250 (500 Ca) MG chewable tablet * : Chew by mouth. * : * : . * : Cholecalciferol (VITAMIN D-1000 MAX ST) 25 MCG (1000 UT) tablet * : Take by mouth. * : * : . * : diclofenac Sodium (VOLTAREN) 1 % GEL * : * : * : . * : famotidine (PEPCID) 10 MG tablet * : Take by mouth. * : * : . * : ibuprofen (ADVIL,MOTRIN) 200 MG tablet * : Take 200 mg by mouth every 6 (six) hours as needed. * : * : . * : Naproxen Sodium (ALEVE) 220 MG CAPS * : Aleve 220 mg capsule Take by oral route. * : * : . * : predniSONE (STERAPRED UNI-PAK 21 TAB) 5 MG (21) TBPK tablet * : * : * : . * : tiZANidine (ZANAFLEX) 2 MG tablet * : * : * : . * :  traMADol (ULTRAM) 50 MG tablet * : * : * : . * : vitamin B-12 (CYANOCOBALAMIN) 1000 MCG tablet * : Take by mouth. * : * : ALLERGIES: Allergies Allergen * : Reactions . * : Amitriptyline Hcl * : * : * : Elevation in muscle enzymes . * : Clindamycin/Lincomycin * : * : * : Causes esophagitis . * : Zithromax Azithromycin * : Rash . * : Capsaicin * : . * : Ceclor Cefaclor * : Nausea Only FAMILY HISTORY: Family History Problem * : Relation * : Age of Onset . * : Heart attack * : Father * : . * : Heart disease * : Sister * : . * : Cancer * : Sister * : . * : Breast cancer * : Sister * : . * : Coronary artery disease * : Brother * : . * : Diabetes * : Sister * : . * : Alzheimer's disease * : Brother * : . * : Diabetes * : Brother Denies any family history of brain aneurysms. REVIEW OF SYSTEMS: As above. PHYSICAL EXAMINATION: Alert, awake and oriented to time, place, space. Affect appropriate. Normal eye contact and responses. Grossly no abnormal lateralizing neurological abnormalities noted. Patient extubated no difficulty with walking. ASSESSMENT AND PLAN: Patient's recent MRA of the brain was reviewed. 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 Discussed was the natural history of unruptured brain aneurysms. The risk of rupture of 1-2% per year with  attendant significant mortality and morbidity were reviewed. Increased risk of rupture were those of female gender, hypertension, smoking, and strong family history of brain rupture for unruptured aneurysm. She notes the aneurysm was apparently seen on a previous MRA examination some years ago. She reports the CD is probably with Dr. Georgie Chard office. Management considerations were those of continued surveillance with follow-up MRA studies versus consideration of a elimination of the aneurysm from circulation via endovascular treatment. Treatment options were of primary coiling versus stent assisted coiling  versus the use of an intra saccular flow disruption device were discussed. The procedure,, including the risks of an ischemic event with remote possibility of intraprocedural rupture leading to death were reviewed. The endovascular procedure would be under general anesthesia with the patient having to spend overnight in the ICU. Patient expressed understanding and would like to proceed with endovascular treatment. This will be scheduled at the earliest possible. Patient will be started on aspirin 81 mg a day, and Plavix 75 mg a day at least 7-10 days prior to the procedure. Questions were answered to her satisfaction. The patient was advised to call should she have any concerns or questions. Electronically Signed   By: Luanne Bras M.D.   On: 07/28/2021 08:19    Labs:  CBC: No results for input(s): WBC, HGB, HCT, PLT in the last 8760 hours.  COAGS: No results for input(s): INR, APTT in the last 8760 hours.  BMP: No results for input(s): NA, K, CL, CO2, GLUCOSE, BUN, CALCIUM, CREATININE, GFRNONAA, GFRAA in the last 8760 hours.  Invalid input(s): CMP  LIVER FUNCTION TESTS: No results for input(s): BILITOT, AST, ALT, ALKPHOS, PROT, ALBUMIN in the last 8760 hours.  TUMOR MARKERS: No results for input(s): AFPTM, CEA, CA199, CHROMGRNA in the last 8760 hours.  Assessment and Plan:  Scheduled today for Cerebral arteriogram with possible anterior communicating artery aneurysm embolization Risks and benefits of cerebral angiogram with intervention were discussed with the patient including, but not limited to bleeding, infection, vascular injury, contrast induced renal failure, stroke or even death.  This interventional procedure involves the use of X-rays and because of the nature of the planned procedure, it is possible that we will have prolonged use of X-ray fluoroscopy.  Potential radiation risks to you include (but are not limited to) the following: - A slightly elevated risk for  cancer  several years later in life. This risk is typically less than 0.5% percent. This risk is low in comparison to the normal incidence of human cancer, which is 33% for women and 50% for men according to the [place]. - Radiation induced injury can include skin redness, resembling a rash, tissue breakdown / ulcers and hair loss (which can be temporary or permanent).   The likelihood of either of these occurring depends on the difficulty of the procedure and whether you are sensitive to radiation due to previous procedures, disease, or genetic conditions.   IF your procedure requires a prolonged use of radiation, you will be notified and given written instructions for further action.  It is your responsibility to monitor the irradiated area for the 2 weeks following the procedure and to notify your physician if you are concerned that you have suffered a radiation induced injury.    All of the patient's questions were answered, patient is agreeable to proceed.  Consent signed and in chart.   Thank you for this interesting consult.  I greatly enjoyed meeting Theresa [name] and look forward to participating  in their care.  A copy of this report was sent to the requesting provider on this date.  Electronically Signed: Lavonia Drafts, PA-C 08/18/2021, 6:58 AM   I spent a total of  30 Minutes   in face to face in clinical consultation, greater than 50% of which was counseling/coordinating care for anterior communicating artery aneurysm embolization

## 2021-08-18 NOTE — Progress Notes (Signed)
Pt arrived to 4N without any belongings; states she arrived to hospital with clothing, purse, phone, upper dentures and lower partial plate. Will call to ask regarding belongings.

## 2021-08-19 ENCOUNTER — Other Ambulatory Visit: Payer: Self-pay | Admitting: Radiology

## 2021-08-19 ENCOUNTER — Encounter (HOSPITAL_COMMUNITY): Payer: Self-pay | Admitting: Interventional Radiology

## 2021-08-19 DIAGNOSIS — I671 Cerebral aneurysm, nonruptured: Secondary | ICD-10-CM

## 2021-08-19 LAB — CBC WITH DIFFERENTIAL/PLATELET
Abs Immature Granulocytes: 0.05 10*3/uL (ref 0.00–0.07)
Basophils Absolute: 0 10*3/uL (ref 0.0–0.1)
Basophils Relative: 0 %
Eosinophils Absolute: 0 10*3/uL (ref 0.0–0.5)
Eosinophils Relative: 0 %
HCT: 30.5 % — ABNORMAL LOW (ref 36.0–46.0)
Hemoglobin: 10 g/dL — ABNORMAL LOW (ref 12.0–15.0)
Immature Granulocytes: 1 %
Lymphocytes Relative: 14 %
Lymphs Abs: 1.3 10*3/uL (ref 0.7–4.0)
MCH: 32.9 pg (ref 26.0–34.0)
MCHC: 32.8 g/dL (ref 30.0–36.0)
MCV: 100.3 fL — ABNORMAL HIGH (ref 80.0–100.0)
Monocytes Absolute: 0.8 10*3/uL (ref 0.1–1.0)
Monocytes Relative: 9 %
Neutro Abs: 7.1 10*3/uL (ref 1.7–7.7)
Neutrophils Relative %: 76 %
Platelets: 181 10*3/uL (ref 150–400)
RBC: 3.04 MIL/uL — ABNORMAL LOW (ref 3.87–5.11)
RDW: 13.7 % (ref 11.5–15.5)
WBC: 9.3 10*3/uL (ref 4.0–10.5)
nRBC: 0 % (ref 0.0–0.2)

## 2021-08-19 LAB — BASIC METABOLIC PANEL
Anion gap: 5 (ref 5–15)
BUN: 8 mg/dL (ref 8–23)
CO2: 21 mmol/L — ABNORMAL LOW (ref 22–32)
Calcium: 6.9 mg/dL — ABNORMAL LOW (ref 8.9–10.3)
Chloride: 114 mmol/L — ABNORMAL HIGH (ref 98–111)
Creatinine, Ser: 0.61 mg/dL (ref 0.44–1.00)
GFR, Estimated: >60 mL/min (ref >60)
Glucose, Bld: 122 mg/dL — ABNORMAL HIGH (ref 70–99)
Potassium: 3.5 mmol/L (ref 3.5–5.1)
Sodium: 140 mmol/L (ref 135–145)

## 2021-08-19 NOTE — Discharge Summary (Signed)
Physician Discharge Summary      Patient ID: Theresa [name] MRN: [id number] DOB/AGE: 86-Jun-1934 86 y.o.  Admit date: 08/18/2021 Discharge date: 08/19/2021  Admission Diagnoses: Principal Problem:   Brain aneurysm  Discharge Diagnoses:  Principal Problem:   Brain aneurysm    Procedures: Procedure(s): EMBOLIZATION of AComm aneurysm with web device  Discharged Condition: good  Hospital Course: Admitted post procedure on 1/30 to Neuro ICU in stable condition. No immediate procedural complications. No overnight issues. Stable this am, no neuro deficits. Tolerated diet, has been OOB. Determined stable for discharge. All medications, instructions, and follow up plans discussed.  Consults: None   Discharge Exam: Blood pressure (!) 86/50, pulse 71, temperature (!) 96.3 F (35.7 C), temperature source Axillary, resp. rate 13, height 4\' 11"  (1.499 m), weight 63.5 kg, SpO2 92 %. NAD Lungs: CTA without w/r/r Heart: Regular Alert, aware and oriented X 3 Speech and comprehension is intact.  Comprehension and fluency are normal.  Judgment and insight normal PERRLA bilaterally No facial droop noted Tongue midline Can spontaneously move all 4 extremities. Hand grip strength equal bilaterally. Dorsiflexion 5/5 bilaterally. Plantar flexion 5/5 bilaterally.  Negative pronator drift Fine motor and coordination intact.  (R)groin soft, NT, no hematoma Feet warm. Brisk cap refill  Disposition: Discharge disposition: 01-Home or Self Care       Discharge Instructions     Call MD for:  difficulty breathing, headache or visual disturbances   Complete by: As directed    Call MD for:  persistant nausea and vomiting   Complete by: As directed    Call MD for:  redness, tenderness, or signs of infection (pain, swelling, redness, odor or green/yellow discharge around incision site)   Complete by: As directed    Call MD for:  severe uncontrolled pain   Complete by: As  directed    Call MD for:  temperature >100.4   Complete by: As directed    Diet - low sodium heart healthy   Complete by: As directed    Driving Restrictions   Complete by: As directed    Avoid driving for 2 weeks unless emergency   Increase activity slowly   Complete by: As directed    Lifting restrictions   Complete by: As directed    No lifting, pushing, pulling greater than 10 lbs for 2 weeks   Remove dressing in 24 hours   Complete by: As directed       Allergies as of 08/19/2021       Reactions   Amitriptyline Hcl    Elevation in muscle enzymes   Clindamycin/lincomycin    Causes esophagitis   Zithromax [azithromycin] Rash   Capsaicin    Pt does not remember    Ceclor [cefaclor] Nausea Only   Shellfish Allergy Rash   Lobster Only - made pt sick, caused blisters          Medication List     TAKE these medications    albuterol 108 (90 Base) MCG/ACT inhaler Commonly known as: VENTOLIN HFA Inhale 1-2 puffs into the lungs every 4 (four) hours as needed for wheezing or shortness of breath.   aspirin 81 MG EC tablet Take 81 mg by mouth daily.   atorvastatin 20 MG tablet Commonly known as: LIPITOR Take 20 mg by mouth daily.   b complex vitamins capsule Take 1 capsule by mouth daily.   Black Pepper-Turmeric 3-500 MG Caps Take 1,500 mg by mouth daily.   Cholecalciferol 25 MCG (1000 UT) tablet  Take 1,000 Units by mouth daily.   clopidogrel 75 MG tablet Commonly known as: Plavix Take 1 tablet (75 mg total) by mouth daily.   diclofenac Sodium 1 % Gel Commonly known as: VOLTAREN 1 application daily as needed (pain).   famotidine 40 MG tablet Commonly known as: PEPCID Take 40 mg by mouth daily as needed for heartburn.   GLUCOSAMINE CHONDROITIN JOINT PO Take 30 mLs by mouth daily.   naproxen sodium 220 MG tablet Commonly known as: ALEVE Take 220 mg by mouth at bedtime as needed (pain).   Prolia 60 MG/ML Sosy injection Generic drug:  denosumab Inject 60 mg into the skin every 6 (six) months.   vitamin B-12 1000 MCG tablet Commonly known as: CYANOCOBALAMIN Take 1,000 mcg by mouth daily.        Follow-up Information     Luanne Bras, MD. Go in 2 week(s).   Specialties: Interventional Radiology, Radiology Why: Clinic will call you to set up follow up appt Contact information: [address] [place],  [place] [address] [place] [address] [phone]                 Signed: Ascencion Dike PA-C 08/19/2021, 9:10 AM

## 2021-08-19 NOTE — Progress Notes (Signed)
°  Transition of Care [facility]) Screening Note   Patient Details  Name: Theresa [name] Date of Birth: Aug 07, 1932   Transition of Care [facility]) CM/SW Contact:    Benard Halsted, LCSW Phone Number: 08/19/2021, 9:31 AM    Transition of Care Department [facility]) has reviewed patient and no TOC needs have been identified at this time. We will continue to monitor patient advancement through interdisciplinary progression rounds. If new patient transition needs arise, please place a TOC consult.

## 2021-08-21 NOTE — Anesthesia Postprocedure Evaluation (Signed)
Anesthesia Post Note  Patient: LISSA ROWLES  Procedure(s) Performed: EMBOLIZATION IR TRANSCATH/EMBOLIZ IR CT HEAD LTD IR 3D INDEPENDENT WKST IR US GUIDE VASC ACCESS RIGHT IR ANGIOGRAM FOLLOW UP STUDY IR ANGIO INTRA EXTRACRAN SEL INTERNAL CAROTID BILAT MOD SED IR ANGIO VERTEBRAL SEL SUBCLAVIAN INNOMINATE BILAT MOD SED     Patient location during evaluation: PACU Anesthesia Type: General Level of consciousness: awake and alert Pain management: pain level controlled Vital Signs Assessment: post-procedure vital signs reviewed and stable Respiratory status: spontaneous breathing, nonlabored ventilation, respiratory function stable and patient connected to nasal cannula oxygen Cardiovascular status: blood pressure returned to baseline and stable Postop Assessment: no apparent nausea or vomiting Anesthetic complications: no   No notable events documented.  Last Vitals:  Vitals:   08/19/21 0800 08/19/21 0900  BP: (!) 87/43 (!) 86/50  Pulse: 69 71  Resp: 16 13  Temp: (!) 35.7 C   SpO2: 96% 92%    Last Pain:  Vitals:   08/19/21 0800  TempSrc: Axillary  PainSc: 0-No pain                 Citlali Gautney

## 2021-08-22 LAB — BPAM RBC
Blood Product Expiration Date: 202302232359
Blood Product Expiration Date: 202302232359
Unit Type and Rh: 6200
Unit Type and Rh: 6200

## 2021-08-22 LAB — TYPE AND SCREEN
ABO/RH(D): A POS
Antibody Screen: NEGATIVE
Unit division: 0
Unit division: 0

## 2021-08-25 ENCOUNTER — Other Ambulatory Visit (HOSPITAL_COMMUNITY): Payer: Self-pay | Admitting: Interventional Radiology

## 2021-08-25 ENCOUNTER — Telehealth (HOSPITAL_COMMUNITY): Payer: Self-pay

## 2021-08-25 DIAGNOSIS — I729 Aneurysm of unspecified site: Secondary | ICD-10-CM

## 2021-08-25 DIAGNOSIS — R1909 Other intra-abdominal and pelvic swelling, mass and lump: Secondary | ICD-10-CM

## 2021-08-25 DIAGNOSIS — R946 Abnormal results of thyroid function studies: Secondary | ICD-10-CM | POA: Diagnosis not present

## 2021-08-25 DIAGNOSIS — D649 Anemia, unspecified: Secondary | ICD-10-CM | POA: Diagnosis not present

## 2021-08-25 NOTE — Progress Notes (Signed)
Pt called Theresa [name] and reports that she has bruising from groin site to knee. She also endorses pain with knot at groin site. Dr. Estanislado Pandy made aware. Dr. Estanislado Pandy request that pt comes in for [place] groin exam. Theresa [name] notified to schedule pt for US exam and to rest groin site.     Narda Rutherford, AGNP-BC 08/25/2021, 1:17 PM

## 2021-08-25 NOTE — Telephone Encounter (Signed)
-----   Message from Tyson Alias, NP sent at 08/25/2021  1:18 PM EST ----- Regarding: RE: Bruising Thank you!  ----- Message ----- From: Danielle Dess Sent: 08/25/2021   1:13 PM EST To: Tyson Alias, NP Subject: RE: Bruising                                   Yes, i'll call her now.   Thank you!  ----- Message ----- From: Tyson Alias, NP Sent: 08/25/2021   1:00 PM EST To: Danielle Dess Subject: RE: Bruising                                   Hello Caryl Pina,  Dr. Herminio Heads wants her to come in today/tomorrow for [place] of her groin to make sure she doesn't have hematoma. He alsop wants her to not drive, bend, stoop, climb steps or lift as to not aggravate groin site. Will you schedule this for her please and make sure she is resting leg? Thanks,  Manuela Schwartz  ----- Message ----- From: Danielle Dess Sent: 08/25/2021   8:51 AM EST To: Tyson Alias, NP Subject: Bruising                                       Manuela Schwartz,   I called Ms. Swindle to schedule her f/u. She mentioned that she has bruising from her groin down to her knee and a knot at her groin. She said that the bruises aren't painful but the knot it pretty painful. She has an appt with her PCP this afternoon for blood work and she is going to try and have him check it out but I wanted to let you know also in case Deveshwar wanted to do anything or was concerned.   Thanks,  Lia Foyer

## 2021-08-26 ENCOUNTER — Ambulatory Visit (HOSPITAL_COMMUNITY): Admission: RE | Admit: 2021-08-26 | Payer: Medicare HMO | Source: Ambulatory Visit

## 2021-08-28 ENCOUNTER — Other Ambulatory Visit: Payer: Self-pay | Admitting: Student

## 2021-08-28 ENCOUNTER — Other Ambulatory Visit: Payer: Self-pay

## 2021-08-28 ENCOUNTER — Telehealth: Payer: Self-pay | Admitting: Student

## 2021-08-28 ENCOUNTER — Ambulatory Visit (HOSPITAL_COMMUNITY)
Admission: RE | Admit: 2021-08-28 | Discharge: 2021-08-28 | Disposition: A | Discharge status: Home / Self Care | Payer: Medicare HMO | Source: Ambulatory Visit | Attending: Interventional Radiology | Admitting: Interventional Radiology

## 2021-08-28 DIAGNOSIS — R1909 Other intra-abdominal and pelvic swelling, mass and lump: Secondary | ICD-10-CM | POA: Diagnosis not present

## 2021-08-28 NOTE — Progress Notes (Signed)
VASCULAR LAB    Right groin ultrasound has been performed.  See CV proc for preliminary results.  Left message on [place], RVT 08/28/2021, 9:38 AM

## 2021-08-28 NOTE — Telephone Encounter (Signed)
Patient called IR today wondering how long she needs to take Plavix. Patient notified (voice mail message left) that she needs to continue taking plavix until she follows up with Dr. Estanislado Pandy on 09/10/21. Patient notified that her original prescription for Plavix had several refills authorized. Patient advised to call her pharmacy and request refill. She has the number to the APP office to call if she has any questions/concerns.  Soyla Dryer, [place] [phone] 08/28/2021, 1:48 PM

## 2021-09-08 DIAGNOSIS — Z6828 Body mass index (BMI) 28.0-28.9, adult: Secondary | ICD-10-CM | POA: Diagnosis not present

## 2021-09-08 DIAGNOSIS — M25511 Pain in right shoulder: Secondary | ICD-10-CM | POA: Diagnosis not present

## 2021-09-08 DIAGNOSIS — M4322 Fusion of spine, cervical region: Secondary | ICD-10-CM | POA: Diagnosis not present

## 2021-09-10 ENCOUNTER — Ambulatory Visit (HOSPITAL_COMMUNITY)
Admission: RE | Admit: 2021-09-10 | Discharge: 2021-09-10 | Disposition: A | Discharge status: Home / Self Care | Payer: Medicare HMO | Source: Ambulatory Visit | Attending: Radiology | Admitting: Radiology

## 2021-09-10 ENCOUNTER — Other Ambulatory Visit: Payer: Self-pay

## 2021-09-10 DIAGNOSIS — M5414 Radiculopathy, thoracic region: Secondary | ICD-10-CM | POA: Diagnosis not present

## 2021-09-10 DIAGNOSIS — M531 Cervicobrachial syndrome: Secondary | ICD-10-CM | POA: Diagnosis not present

## 2021-09-10 DIAGNOSIS — M9901 Segmental and somatic dysfunction of cervical region: Secondary | ICD-10-CM | POA: Diagnosis not present

## 2021-09-10 DIAGNOSIS — I671 Cerebral aneurysm, nonruptured: Secondary | ICD-10-CM

## 2021-09-10 DIAGNOSIS — M9902 Segmental and somatic dysfunction of thoracic region: Secondary | ICD-10-CM | POA: Diagnosis not present

## 2021-09-11 DIAGNOSIS — M810 Age-related osteoporosis without current pathological fracture: Secondary | ICD-10-CM | POA: Diagnosis not present

## 2021-09-11 HISTORY — PX: IR RADIOLOGIST EVAL & MGMT: IMG5224

## 2021-10-05 DIAGNOSIS — S82201A Unspecified fracture of shaft of right tibia, initial encounter for closed fracture: Secondary | ICD-10-CM | POA: Diagnosis not present

## 2021-10-07 DIAGNOSIS — S82391A Other fracture of lower end of right tibia, initial encounter for closed fracture: Secondary | ICD-10-CM | POA: Diagnosis not present

## 2021-10-21 DIAGNOSIS — S82391D Other fracture of lower end of right tibia, subsequent encounter for closed fracture with routine healing: Secondary | ICD-10-CM | POA: Diagnosis not present

## 2021-11-04 DIAGNOSIS — S82391D Other fracture of lower end of right tibia, subsequent encounter for closed fracture with routine healing: Secondary | ICD-10-CM | POA: Diagnosis not present

## 2021-12-02 DIAGNOSIS — S82391D Other fracture of lower end of right tibia, subsequent encounter for closed fracture with routine healing: Secondary | ICD-10-CM | POA: Diagnosis not present

## 2021-12-11 ENCOUNTER — Inpatient Hospital Stay: Admission: RE | Admit: 2021-12-11 | Payer: Medicare HMO | Source: Ambulatory Visit

## 2021-12-11 ENCOUNTER — Ambulatory Visit
Admission: RE | Admit: 2021-12-11 | Discharge: 2021-12-11 | Disposition: A | Discharge status: Home / Self Care | Payer: Medicare HMO | Source: Ambulatory Visit | Attending: Family Medicine | Admitting: Family Medicine

## 2021-12-11 DIAGNOSIS — M810 Age-related osteoporosis without current pathological fracture: Secondary | ICD-10-CM

## 2021-12-11 DIAGNOSIS — M85852 Other specified disorders of bone density and structure, left thigh: Secondary | ICD-10-CM | POA: Diagnosis not present

## 2021-12-11 DIAGNOSIS — Z780 Asymptomatic menopausal state: Secondary | ICD-10-CM | POA: Diagnosis not present

## 2021-12-24 ENCOUNTER — Ambulatory Visit: Payer: Medicare HMO | Admitting: Neurology

## 2021-12-24 DIAGNOSIS — R2681 Unsteadiness on feet: Secondary | ICD-10-CM | POA: Diagnosis not present

## 2021-12-24 DIAGNOSIS — J0100 Acute maxillary sinusitis, unspecified: Secondary | ICD-10-CM | POA: Diagnosis not present

## 2021-12-24 DIAGNOSIS — R1032 Left lower quadrant pain: Secondary | ICD-10-CM | POA: Diagnosis not present

## 2021-12-24 DIAGNOSIS — E785 Hyperlipidemia, unspecified: Secondary | ICD-10-CM | POA: Diagnosis not present

## 2022-01-01 DIAGNOSIS — G894 Chronic pain syndrome: Secondary | ICD-10-CM | POA: Diagnosis not present

## 2022-01-01 DIAGNOSIS — Z79891 Long term (current) use of opiate analgesic: Secondary | ICD-10-CM | POA: Diagnosis not present

## 2022-01-01 DIAGNOSIS — Z79899 Other long term (current) drug therapy: Secondary | ICD-10-CM | POA: Diagnosis not present

## 2022-01-01 DIAGNOSIS — M25511 Pain in right shoulder: Secondary | ICD-10-CM | POA: Diagnosis not present

## 2022-01-01 DIAGNOSIS — M4322 Fusion of spine, cervical region: Secondary | ICD-10-CM | POA: Diagnosis not present

## 2022-01-02 ENCOUNTER — Other Ambulatory Visit: Payer: Self-pay | Admitting: Rehabilitation

## 2022-01-02 DIAGNOSIS — M25511 Pain in right shoulder: Secondary | ICD-10-CM

## 2022-01-02 DIAGNOSIS — R062 Wheezing: Secondary | ICD-10-CM | POA: Diagnosis not present

## 2022-01-02 DIAGNOSIS — R1030 Lower abdominal pain, unspecified: Secondary | ICD-10-CM | POA: Diagnosis not present

## 2022-01-02 DIAGNOSIS — N329 Bladder disorder, unspecified: Secondary | ICD-10-CM | POA: Diagnosis not present

## 2022-01-02 DIAGNOSIS — R051 Acute cough: Secondary | ICD-10-CM | POA: Diagnosis not present

## 2022-01-02 DIAGNOSIS — R2681 Unsteadiness on feet: Secondary | ICD-10-CM | POA: Diagnosis not present

## 2022-01-02 DIAGNOSIS — Z8719 Personal history of other diseases of the digestive system: Secondary | ICD-10-CM | POA: Diagnosis not present

## 2022-01-02 DIAGNOSIS — R269 Unspecified abnormalities of gait and mobility: Secondary | ICD-10-CM | POA: Diagnosis not present

## 2022-01-02 DIAGNOSIS — G629 Polyneuropathy, unspecified: Secondary | ICD-10-CM | POA: Diagnosis not present

## 2022-01-15 NOTE — Progress Notes (Deleted)
NEUROLOGY FOLLOW UP OFFICE NOTE  [name] [id number]  Assessment/Plan:   Gait instability - chronic problem.  Likely multifactorial, related to underlying neuropathy, arthritis or residual to lumbar spinal stenosis. I would suspect that the cerebellar infarct is unrelated as it was noted on MRI from 2017 and she reported balance problems beginning in 2020. 2.   Chronic cerebellar infarct - from my review of both recent imaging and prior imaging from 2017, I do not appreciate any significant change.  3.   Acom aneurysm s/p embolization 4.   Cervicogenic headache 5.   Cervical spondylosis with spinal stenosis   1  She has upcoming MRI of brain with and without contrast and MRA of head to further evaluate cerebellar lesion (which is likely remote infarct) and possible aneurysm. 2  Will consider small dose of gabapentin '100mg'$  at bedtime to treat headaches.  She would like to wait until imaging is completed. 3  Follow up    Subjective:  Theresa [name] is an 88 year old right-handed female with cervical spondylosis, cervicobrachial syndrome, cervicogenic headache, arthritis, MVP and asthma who follows up for headaches and falls. MRI of brain and cervical spine on 06/03/2021 personally reviewed.   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  Underwent endovascular embolization in January.  On ASA.  Supposed to have repeat MRI/MRA of head within the next month.   HISTORY: Recurrent falls since 2020.  She did not lose consciousness with either of these falls.  CT head without contrast from 05/05/2019 was personally reviewed and demonstrated no acute intracranial abnormality.  She reports some lightheadedness since the fall.  When she gets up in the morning, she has to sit on side of bed until she can focus.  It may occur spontaneously as well.  She still has some pain on the left side of her head down the left posterior side of her neck. She does have a  history of left sided occipital/cervicogenic pressure and paroxysmal headache.  She also has history of cervical spondylosis with prior cervical fusion.  She had previously had cervicogenic headaches several years ago, which subsequently resolved.  She began having headaches again after a fall in late 2022.  Described bifrontal pressure.  Still has occipital headaches as well.  She has neck pain with difficulty turning her head to the right.  MRI of brain without contrast on 06/03/2021 personally reviewed showed 5.7 mm T2/FLAIR hyperintense focus within the right upper aspect of the cerebellum as well as possible 6.7 mm aneurysm at location of the anterior communicating artery.    MRI of cervical spine showed ACDF at C4-7 and posterior fusion at C4-T2 with multilevel degenerative changes including mild to moderate canal stenosis at C2-3, moderate bilateral foraminal stenosis at C3-4, T2-3 and T3-4.   She has known lumbar spinal stenosis.  She underwent L3-4 and L4-L5 laminectomy with L4-L5 fusion on 01/10/2019.  She reports residual left leg weakness and left knee pain.  At times, she still reports pain when she walks.  She reports numbness in the feet from toes up to ankles.  She reports arthritis. B12 and TSH from November 2020 were 361 and 2.21 respectively.   MRI brain without contrast from 02/19/2016 personally reviewed showed chronic small vessel ischemic changes in the periventricular and subcortical cerebral white matter, as well as lacunar infarct in right cerebellar hemisphere, stable compared to prior MRI from 07/15/2012.      PAST MEDICAL HISTORY: Past Medical  History:  Diagnosis Date   Anemia    Arthritis    Degenerative   Asthma    Cervical spondylosis    Cervicogenic headache    COVID 2020   Gastroesophageal reflux disease    Headache    History of renal calculi    Memory difficulties 02/10/2016   Mitral valve prolapse    Pneumonia    Stroke [facility])    seen on MRI     MEDICATIONS: Current Outpatient Medications on File Prior to Visit  Medication Sig Dispense Refill   albuterol (VENTOLIN HFA) 108 (90 Base) MCG/ACT inhaler Inhale 1-2 puffs into the lungs every 4 (four) hours as needed for wheezing or shortness of breath.     aspirin 81 MG EC tablet Take 81 mg by mouth daily.     atorvastatin (LIPITOR) 20 MG tablet Take 20 mg by mouth daily.     b complex vitamins capsule Take 1 capsule by mouth daily.     Black Pepper-Turmeric 3-500 MG CAPS Take 1,500 mg by mouth daily.     Cholecalciferol 25 MCG (1000 UT) tablet Take 1,000 Units by mouth daily.     clopidogrel (PLAVIX) 75 MG tablet Take 1 tablet (75 mg total) by mouth daily. 30 tablet 6   denosumab (PROLIA) 60 MG/ML SOSY injection Inject 60 mg into the skin every 6 (six) months.     diclofenac Sodium (VOLTAREN) 1 % GEL 1 application daily as needed (pain).     famotidine (PEPCID) 40 MG tablet Take 40 mg by mouth daily as needed for heartburn.     Glucos-Chondroit-Hyaluron-MSM (GLUCOSAMINE CHONDROITIN JOINT PO) Take 30 mLs by mouth daily.     naproxen sodium (ALEVE) 220 MG tablet Take 220 mg by mouth at bedtime as needed (pain).     vitamin B-12 (CYANOCOBALAMIN) 1000 MCG tablet Take 1,000 mcg by mouth daily.     No current facility-administered medications on file prior to visit.    ALLERGIES: Allergies  Allergen Reactions   Amitriptyline Hcl     Elevation in muscle enzymes   Clindamycin/Lincomycin     Causes esophagitis   Zithromax [Azithromycin] Rash   Capsaicin     Pt does not remember    Ceclor [Cefaclor] Nausea Only   Shellfish Allergy Rash    Lobster Only - made pt sick, caused blisters      FAMILY HISTORY: Family History  Problem Relation Age of Onset   Heart attack Father    Heart disease Sister    Cancer Sister    Breast cancer Sister    Coronary artery disease Brother    Diabetes Sister    Alzheimer's disease Brother    Diabetes Brother       Objective:   *** General: No acute distress.  Patient appears well-groomed.   Head:  Normocephalic/atraumatic Eyes:  Fundi examined but not visualized Neck: supple, no paraspinal tenderness, full range of motion Heart:  Regular rate and rhythm Lungs:  Clear to auscultation bilaterally Back: No paraspinal tenderness Neurological Exam: alert and oriented to person, place, and time.  Speech fluent and not dysarthric, language intact.  CN II-XII intact. Bulk and tone normal, muscle strength 5/5 throughout.  Sensation to light touch intact.  Deep tendon reflexes 2+ throughout, toes downgoing.  Finger to nose testing intact.  Gait normal, Romberg negative.   Metta Clines, DO  CC: Harlan Stains, MD

## 2022-01-16 ENCOUNTER — Ambulatory Visit: Payer: Medicare HMO | Admitting: Neurology

## 2022-01-18 ENCOUNTER — Ambulatory Visit
Admission: RE | Admit: 2022-01-18 | Discharge: 2022-01-18 | Disposition: A | Discharge status: Home / Self Care | Payer: Medicare HMO | Source: Ambulatory Visit | Attending: Rehabilitation | Admitting: Rehabilitation

## 2022-01-18 DIAGNOSIS — M25511 Pain in right shoulder: Secondary | ICD-10-CM

## 2022-01-23 ENCOUNTER — Other Ambulatory Visit: Payer: Self-pay | Admitting: Gastroenterology

## 2022-01-23 DIAGNOSIS — R109 Unspecified abdominal pain: Secondary | ICD-10-CM

## 2022-01-23 DIAGNOSIS — R10A2 Flank pain, left side: Secondary | ICD-10-CM

## 2022-01-23 DIAGNOSIS — M545 Low back pain, unspecified: Secondary | ICD-10-CM | POA: Diagnosis not present

## 2022-01-26 NOTE — Progress Notes (Deleted)
NEUROLOGY FOLLOW UP OFFICE NOTE  Theresa [name] [id number]  Assessment/Plan:   Gait instability - chronic problem.  Likely multifactorial, related to underlying neuropathy, arthritis or residual to lumbar spinal stenosis. I would suspect that the cerebellar infarct is unrelated as it was noted on MRI from 2017 and she reported balance problems beginning in 2020. Chronic cerebellar infarct - from my review of both recent imaging and prior imaging from 2017, I do not appreciate any significant change.  Possible Acom aneurysm Cervicogenic headache Cervical spondylosis with spinal stenosis   1  She has upcoming MRI of brain with and without contrast and MRA of head to further evaluate cerebellar lesion (which is likely remote infarct) and possible aneurysm. 2  Will consider small dose of gabapentin '100mg'$  at bedtime to treat headaches.  She would like to wait until imaging is completed. 3  Follow up    Subjective:  Theresa [name] is an 88 year old right-handed female with cervical spondylosis, cervicobrachial syndrome, cervicogenic headache, arthritis, MVP and asthma who follows up for falls.   UPDATE: Follow up MRI of brain WITH contrast in December revealed nonenhancing 6 mm lesion within the superior aspect of the cerebellar vermis.  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  She saw Dr. Estanislado Pandy and underwent embolization in January.  Repeat MRI and MRA was recommended around now.   HISTORY: In late September-early October 2020, she tripped and fell on her patio, hitting the left side of her forehead on the concrete.  A few days later, she tripped again over a tree branch and striking the right side of her head on the ground.  She did not lose consciousness with either of these falls.  CT head without contrast from 05/05/2019 was personally reviewed and demonstrated no acute intracranial abnormality.  She reports some lightheadedness since the  fall.  When she gets up in the morning, she has to sit on side of bed until she can focus.  It may occur spontaneously as well.  She still has some pain on the left side of her head down the left posterior side of her neck. She does have a history of left sided occipital/cervicogenic and paroxysmal headache.  She also has history of cervical spondylosis with prior cervical fusion.  MRI of cervical spine showed ACDF at C4-7 and posterior fusion at C4-T2 with multilevel degenerative changes including mild to moderate canal stenosis at C2-3, moderate bilateral foraminal stenosis at C3-4, T2-3 and T3-4.  MRI of brain without contrast on 06/03/2021 personally reviewed showed 5.7 mm T2/FLAIR hyperintense focus within the right upper aspect of the cerebellum as well as possible 6.7 mm aneurysm at location of the anterior communicating artery.  ***   She has known lumbar spinal stenosis.  She underwent L3-4 and L4-L5 laminectomy with L4-L5 fusion on 01/10/2019.  She reports residual left leg weakness and left knee pain.  At times, she still reports pain when she walks.  She reports numbness in the feet from toes up to ankles.  She reports arthritis. B12 and TSH from November 2020 were 361 and 2.21 respectively.  PAST MEDICAL HISTORY: Past Medical History:  Diagnosis Date   Anemia    Arthritis    Degenerative   Asthma    Cervical spondylosis    Cervicogenic headache    COVID 2020   Gastroesophageal reflux disease    Headache    History of renal calculi    Memory  difficulties 02/10/2016   Mitral valve prolapse    Pneumonia    Stroke [facility])    seen on MRI    MEDICATIONS: Current Outpatient Medications on File Prior to Visit  Medication Sig Dispense Refill   albuterol (VENTOLIN HFA) 108 (90 Base) MCG/ACT inhaler Inhale 1-2 puffs into the lungs every 4 (four) hours as needed for wheezing or shortness of breath.     aspirin 81 MG EC tablet Take 81 mg by mouth daily.     atorvastatin (LIPITOR) 20 MG  tablet Take 20 mg by mouth daily.     b complex vitamins capsule Take 1 capsule by mouth daily.     Black Pepper-Turmeric 3-500 MG CAPS Take 1,500 mg by mouth daily.     Cholecalciferol 25 MCG (1000 UT) tablet Take 1,000 Units by mouth daily.     clopidogrel (PLAVIX) 75 MG tablet Take 1 tablet (75 mg total) by mouth daily. 30 tablet 6   denosumab (PROLIA) 60 MG/ML SOSY injection Inject 60 mg into the skin every 6 (six) months.     diclofenac Sodium (VOLTAREN) 1 % GEL 1 application daily as needed (pain).     famotidine (PEPCID) 40 MG tablet Take 40 mg by mouth daily as needed for heartburn.     Glucos-Chondroit-Hyaluron-MSM (GLUCOSAMINE CHONDROITIN JOINT PO) Take 30 mLs by mouth daily.     naproxen sodium (ALEVE) 220 MG tablet Take 220 mg by mouth at bedtime as needed (pain).     vitamin B-12 (CYANOCOBALAMIN) 1000 MCG tablet Take 1,000 mcg by mouth daily.     No current facility-administered medications on file prior to visit.    ALLERGIES: Allergies  Allergen Reactions   Amitriptyline Hcl     Elevation in muscle enzymes   Clindamycin/Lincomycin     Causes esophagitis   Zithromax [Azithromycin] Rash   Capsaicin     Pt does not remember    Ceclor [Cefaclor] Nausea Only   Shellfish Allergy Rash    Lobster Only - made pt sick, caused blisters      FAMILY HISTORY: Family History  Problem Relation Age of Onset   Heart attack Father    Heart disease Sister    Cancer Sister    Breast cancer Sister    Coronary artery disease Brother    Diabetes Sister    Alzheimer's disease Brother    Diabetes Brother       Objective:  *** General: No acute distress.  Patient appears well-groomed.   Head:  Normocephalic/atraumatic Eyes:  Fundi examined but not visualized Neck: supple, no paraspinal tenderness, full range of motion Heart:  Regular rate and rhythm Neurological Exam: alert and oriented to person, place, and time.  Speech fluent and not dysarthric, language intact.  CN II-XII  intact. Bulk and tone normal, muscle strength 5/5 throughout.  Sensation to light touch intact.  Deep tendon reflexes 2+ throughout, toes downgoing.  Finger to nose testing intact.  Gait normal, Romberg negative.   Metta Clines, DO  CC: ***

## 2022-01-27 ENCOUNTER — Ambulatory Visit: Payer: Medicare HMO | Admitting: Neurology

## 2022-01-29 DIAGNOSIS — M25511 Pain in right shoulder: Secondary | ICD-10-CM | POA: Diagnosis not present

## 2022-01-29 DIAGNOSIS — M4322 Fusion of spine, cervical region: Secondary | ICD-10-CM | POA: Diagnosis not present

## 2022-02-03 DIAGNOSIS — S82391D Other fracture of lower end of right tibia, subsequent encounter for closed fracture with routine healing: Secondary | ICD-10-CM | POA: Diagnosis not present

## 2022-02-06 DIAGNOSIS — M19011 Primary osteoarthritis, right shoulder: Secondary | ICD-10-CM | POA: Diagnosis not present

## 2022-02-18 ENCOUNTER — Ambulatory Visit
Admission: RE | Admit: 2022-02-18 | Discharge: 2022-02-18 | Disposition: A | Discharge status: Home / Self Care | Payer: Medicare HMO | Source: Ambulatory Visit | Attending: Gastroenterology | Admitting: Gastroenterology

## 2022-02-18 DIAGNOSIS — K429 Umbilical hernia without obstruction or gangrene: Secondary | ICD-10-CM | POA: Diagnosis not present

## 2022-02-18 DIAGNOSIS — M545 Low back pain, unspecified: Secondary | ICD-10-CM

## 2022-02-18 DIAGNOSIS — I700 Atherosclerosis of aorta: Secondary | ICD-10-CM | POA: Diagnosis not present

## 2022-02-18 DIAGNOSIS — N281 Cyst of kidney, acquired: Secondary | ICD-10-CM | POA: Diagnosis not present

## 2022-02-18 DIAGNOSIS — K5730 Diverticulosis of large intestine without perforation or abscess without bleeding: Secondary | ICD-10-CM | POA: Diagnosis not present

## 2022-02-18 DIAGNOSIS — R109 Unspecified abdominal pain: Secondary | ICD-10-CM

## 2022-02-18 MED ORDER — IOPAMIDOL (ISOVUE-370) INJECTION 76%
80.0000 mL | Freq: Once | INTRAVENOUS | Status: AC | PRN
  Administered 2022-02-18: 80 mL via INTRAVENOUS

## 2022-02-23 DIAGNOSIS — M25571 Pain in right ankle and joints of right foot: Secondary | ICD-10-CM | POA: Diagnosis not present

## 2022-02-23 DIAGNOSIS — M75111 Incomplete rotator cuff tear or rupture of right shoulder, not specified as traumatic: Secondary | ICD-10-CM | POA: Diagnosis not present

## 2022-02-25 DIAGNOSIS — M9905 Segmental and somatic dysfunction of pelvic region: Secondary | ICD-10-CM | POA: Diagnosis not present

## 2022-02-25 DIAGNOSIS — M5414 Radiculopathy, thoracic region: Secondary | ICD-10-CM | POA: Diagnosis not present

## 2022-02-25 DIAGNOSIS — M9903 Segmental and somatic dysfunction of lumbar region: Secondary | ICD-10-CM | POA: Diagnosis not present

## 2022-02-25 DIAGNOSIS — M5137 Other intervertebral disc degeneration, lumbosacral region: Secondary | ICD-10-CM | POA: Diagnosis not present

## 2022-02-25 DIAGNOSIS — M9902 Segmental and somatic dysfunction of thoracic region: Secondary | ICD-10-CM | POA: Diagnosis not present

## 2022-02-25 DIAGNOSIS — M5417 Radiculopathy, lumbosacral region: Secondary | ICD-10-CM | POA: Diagnosis not present

## 2022-02-27 ENCOUNTER — Other Ambulatory Visit: Payer: Self-pay | Admitting: Student in an Organized Health Care Education/Training Program

## 2022-02-27 DIAGNOSIS — M5414 Radiculopathy, thoracic region: Secondary | ICD-10-CM | POA: Diagnosis not present

## 2022-02-27 DIAGNOSIS — M5137 Other intervertebral disc degeneration, lumbosacral region: Secondary | ICD-10-CM | POA: Diagnosis not present

## 2022-02-27 DIAGNOSIS — M9902 Segmental and somatic dysfunction of thoracic region: Secondary | ICD-10-CM | POA: Diagnosis not present

## 2022-02-27 DIAGNOSIS — M9905 Segmental and somatic dysfunction of pelvic region: Secondary | ICD-10-CM | POA: Diagnosis not present

## 2022-02-27 DIAGNOSIS — M25571 Pain in right ankle and joints of right foot: Secondary | ICD-10-CM | POA: Diagnosis not present

## 2022-02-27 DIAGNOSIS — M5417 Radiculopathy, lumbosacral region: Secondary | ICD-10-CM | POA: Diagnosis not present

## 2022-02-27 DIAGNOSIS — M75111 Incomplete rotator cuff tear or rupture of right shoulder, not specified as traumatic: Secondary | ICD-10-CM | POA: Diagnosis not present

## 2022-02-27 DIAGNOSIS — M9903 Segmental and somatic dysfunction of lumbar region: Secondary | ICD-10-CM | POA: Diagnosis not present

## 2022-03-02 DIAGNOSIS — M9903 Segmental and somatic dysfunction of lumbar region: Secondary | ICD-10-CM | POA: Diagnosis not present

## 2022-03-02 DIAGNOSIS — M5137 Other intervertebral disc degeneration, lumbosacral region: Secondary | ICD-10-CM | POA: Diagnosis not present

## 2022-03-02 DIAGNOSIS — M5417 Radiculopathy, lumbosacral region: Secondary | ICD-10-CM | POA: Diagnosis not present

## 2022-03-02 DIAGNOSIS — M5414 Radiculopathy, thoracic region: Secondary | ICD-10-CM | POA: Diagnosis not present

## 2022-03-02 DIAGNOSIS — M9905 Segmental and somatic dysfunction of pelvic region: Secondary | ICD-10-CM | POA: Diagnosis not present

## 2022-03-02 DIAGNOSIS — M9902 Segmental and somatic dysfunction of thoracic region: Secondary | ICD-10-CM | POA: Diagnosis not present

## 2022-03-04 ENCOUNTER — Ambulatory Visit
Admission: RE | Admit: 2022-03-04 | Discharge: 2022-03-04 | Disposition: A | Discharge status: Home / Self Care | Payer: Medicare HMO | Source: Ambulatory Visit | Attending: Student in an Organized Health Care Education/Training Program | Admitting: Student in an Organized Health Care Education/Training Program

## 2022-03-04 DIAGNOSIS — M25471 Effusion, right ankle: Secondary | ICD-10-CM | POA: Diagnosis not present

## 2022-03-04 DIAGNOSIS — S82391A Other fracture of lower end of right tibia, initial encounter for closed fracture: Secondary | ICD-10-CM | POA: Diagnosis not present

## 2022-03-04 DIAGNOSIS — M25571 Pain in right ankle and joints of right foot: Secondary | ICD-10-CM

## 2022-03-04 DIAGNOSIS — R531 Weakness: Secondary | ICD-10-CM | POA: Diagnosis not present

## 2022-03-05 DIAGNOSIS — M5417 Radiculopathy, lumbosacral region: Secondary | ICD-10-CM | POA: Diagnosis not present

## 2022-03-05 DIAGNOSIS — M5414 Radiculopathy, thoracic region: Secondary | ICD-10-CM | POA: Diagnosis not present

## 2022-03-05 DIAGNOSIS — M9905 Segmental and somatic dysfunction of pelvic region: Secondary | ICD-10-CM | POA: Diagnosis not present

## 2022-03-05 DIAGNOSIS — M9903 Segmental and somatic dysfunction of lumbar region: Secondary | ICD-10-CM | POA: Diagnosis not present

## 2022-03-05 DIAGNOSIS — M5137 Other intervertebral disc degeneration, lumbosacral region: Secondary | ICD-10-CM | POA: Diagnosis not present

## 2022-03-05 DIAGNOSIS — M9902 Segmental and somatic dysfunction of thoracic region: Secondary | ICD-10-CM | POA: Diagnosis not present

## 2022-03-06 ENCOUNTER — Telehealth: Payer: Self-pay | Admitting: Neurology

## 2022-03-06 DIAGNOSIS — S46011A Strain of muscle(s) and tendon(s) of the rotator cuff of right shoulder, initial encounter: Secondary | ICD-10-CM | POA: Diagnosis not present

## 2022-03-06 NOTE — Telephone Encounter (Signed)
Received Surgical Clearance Form from [facility] to be filled out. Putting in Dr. Georgie Chard box

## 2022-03-09 DIAGNOSIS — M5417 Radiculopathy, lumbosacral region: Secondary | ICD-10-CM | POA: Diagnosis not present

## 2022-03-09 DIAGNOSIS — M9902 Segmental and somatic dysfunction of thoracic region: Secondary | ICD-10-CM | POA: Diagnosis not present

## 2022-03-09 DIAGNOSIS — M5414 Radiculopathy, thoracic region: Secondary | ICD-10-CM | POA: Diagnosis not present

## 2022-03-09 DIAGNOSIS — M9903 Segmental and somatic dysfunction of lumbar region: Secondary | ICD-10-CM | POA: Diagnosis not present

## 2022-03-09 DIAGNOSIS — M5137 Other intervertebral disc degeneration, lumbosacral region: Secondary | ICD-10-CM | POA: Diagnosis not present

## 2022-03-09 DIAGNOSIS — M9905 Segmental and somatic dysfunction of pelvic region: Secondary | ICD-10-CM | POA: Diagnosis not present

## 2022-03-11 DIAGNOSIS — H00025 Hordeolum internum left lower eyelid: Secondary | ICD-10-CM | POA: Diagnosis not present

## 2022-03-12 DIAGNOSIS — M5137 Other intervertebral disc degeneration, lumbosacral region: Secondary | ICD-10-CM | POA: Diagnosis not present

## 2022-03-12 DIAGNOSIS — M9903 Segmental and somatic dysfunction of lumbar region: Secondary | ICD-10-CM | POA: Diagnosis not present

## 2022-03-12 DIAGNOSIS — M9905 Segmental and somatic dysfunction of pelvic region: Secondary | ICD-10-CM | POA: Diagnosis not present

## 2022-03-12 DIAGNOSIS — M810 Age-related osteoporosis without current pathological fracture: Secondary | ICD-10-CM | POA: Diagnosis not present

## 2022-03-12 DIAGNOSIS — M5414 Radiculopathy, thoracic region: Secondary | ICD-10-CM | POA: Diagnosis not present

## 2022-03-12 DIAGNOSIS — M5417 Radiculopathy, lumbosacral region: Secondary | ICD-10-CM | POA: Diagnosis not present

## 2022-03-12 DIAGNOSIS — M9902 Segmental and somatic dysfunction of thoracic region: Secondary | ICD-10-CM | POA: Diagnosis not present

## 2022-03-13 DIAGNOSIS — M19071 Primary osteoarthritis, right ankle and foot: Secondary | ICD-10-CM | POA: Diagnosis not present

## 2022-03-14 ENCOUNTER — Other Ambulatory Visit: Payer: Self-pay

## 2022-03-14 ENCOUNTER — Emergency Department (HOSPITAL_COMMUNITY): Payer: Medicare HMO

## 2022-03-14 ENCOUNTER — Encounter (HOSPITAL_COMMUNITY): Payer: Self-pay | Admitting: *Deleted

## 2022-03-14 ENCOUNTER — Inpatient Hospital Stay (HOSPITAL_COMMUNITY)
Admission: EM | Admit: 2022-03-14 | Discharge: 2022-03-17 | DRG: 551 | Disposition: A | Discharge status: Home / Self Care | Payer: Medicare HMO | Attending: General Surgery | Admitting: General Surgery

## 2022-03-14 DIAGNOSIS — Y92410 Unspecified street and highway as the place of occurrence of the external cause: Secondary | ICD-10-CM

## 2022-03-14 DIAGNOSIS — R109 Unspecified abdominal pain: Secondary | ICD-10-CM | POA: Diagnosis not present

## 2022-03-14 DIAGNOSIS — Z8679 Personal history of other diseases of the circulatory system: Secondary | ICD-10-CM

## 2022-03-14 DIAGNOSIS — S12100A Unspecified displaced fracture of second cervical vertebra, initial encounter for closed fracture: Secondary | ICD-10-CM

## 2022-03-14 DIAGNOSIS — Z9889 Other specified postprocedural states: Secondary | ICD-10-CM | POA: Diagnosis not present

## 2022-03-14 DIAGNOSIS — Z8249 Family history of ischemic heart disease and other diseases of the circulatory system: Secondary | ICD-10-CM

## 2022-03-14 DIAGNOSIS — Z9049 Acquired absence of other specified parts of digestive tract: Secondary | ICD-10-CM

## 2022-03-14 DIAGNOSIS — Q7649 Other congenital malformations of spine, not associated with scoliosis: Secondary | ICD-10-CM | POA: Diagnosis not present

## 2022-03-14 DIAGNOSIS — Z833 Family history of diabetes mellitus: Secondary | ICD-10-CM

## 2022-03-14 DIAGNOSIS — R413 Other amnesia: Secondary | ICD-10-CM | POA: Diagnosis present

## 2022-03-14 DIAGNOSIS — Z87442 Personal history of urinary calculi: Secondary | ICD-10-CM

## 2022-03-14 DIAGNOSIS — Z803 Family history of malignant neoplasm of breast: Secondary | ICD-10-CM | POA: Diagnosis not present

## 2022-03-14 DIAGNOSIS — Z881 Allergy status to other antibiotic agents status: Secondary | ICD-10-CM | POA: Diagnosis not present

## 2022-03-14 DIAGNOSIS — S8254XA Nondisplaced fracture of medial malleolus of right tibia, initial encounter for closed fracture: Secondary | ICD-10-CM | POA: Diagnosis not present

## 2022-03-14 DIAGNOSIS — S0990XA Unspecified injury of head, initial encounter: Secondary | ICD-10-CM | POA: Diagnosis not present

## 2022-03-14 DIAGNOSIS — X58XXXD Exposure to other specified factors, subsequent encounter: Secondary | ICD-10-CM | POA: Diagnosis present

## 2022-03-14 DIAGNOSIS — S0003XA Contusion of scalp, initial encounter: Secondary | ICD-10-CM | POA: Diagnosis not present

## 2022-03-14 DIAGNOSIS — M19012 Primary osteoarthritis, left shoulder: Secondary | ICD-10-CM | POA: Diagnosis not present

## 2022-03-14 DIAGNOSIS — Z90710 Acquired absence of both cervix and uterus: Secondary | ICD-10-CM

## 2022-03-14 DIAGNOSIS — M79604 Pain in right leg: Secondary | ICD-10-CM | POA: Diagnosis not present

## 2022-03-14 DIAGNOSIS — G8929 Other chronic pain: Secondary | ICD-10-CM | POA: Diagnosis present

## 2022-03-14 DIAGNOSIS — M4316 Spondylolisthesis, lumbar region: Secondary | ICD-10-CM | POA: Diagnosis not present

## 2022-03-14 DIAGNOSIS — Z79899 Other long term (current) drug therapy: Secondary | ICD-10-CM

## 2022-03-14 DIAGNOSIS — R410 Disorientation, unspecified: Secondary | ICD-10-CM | POA: Diagnosis not present

## 2022-03-14 DIAGNOSIS — K219 Gastro-esophageal reflux disease without esophagitis: Secondary | ICD-10-CM | POA: Diagnosis present

## 2022-03-14 DIAGNOSIS — S22070A Wedge compression fracture of T9-T10 vertebra, initial encounter for closed fracture: Secondary | ICD-10-CM | POA: Diagnosis not present

## 2022-03-14 DIAGNOSIS — S12110A Anterior displaced Type II dens fracture, initial encounter for closed fracture: Principal | ICD-10-CM | POA: Diagnosis present

## 2022-03-14 DIAGNOSIS — J45909 Unspecified asthma, uncomplicated: Secondary | ICD-10-CM | POA: Diagnosis present

## 2022-03-14 DIAGNOSIS — M542 Cervicalgia: Secondary | ICD-10-CM | POA: Diagnosis not present

## 2022-03-14 DIAGNOSIS — Z7982 Long term (current) use of aspirin: Secondary | ICD-10-CM

## 2022-03-14 DIAGNOSIS — Z981 Arthrodesis status: Secondary | ICD-10-CM | POA: Diagnosis not present

## 2022-03-14 DIAGNOSIS — Z91013 Allergy to seafood: Secondary | ICD-10-CM

## 2022-03-14 DIAGNOSIS — M7989 Other specified soft tissue disorders: Secondary | ICD-10-CM | POA: Diagnosis not present

## 2022-03-14 DIAGNOSIS — R58 Hemorrhage, not elsewhere classified: Secondary | ICD-10-CM | POA: Diagnosis not present

## 2022-03-14 DIAGNOSIS — S82301D Unspecified fracture of lower end of right tibia, subsequent encounter for closed fracture with routine healing: Secondary | ICD-10-CM

## 2022-03-14 DIAGNOSIS — Z8673 Personal history of transient ischemic attack (TIA), and cerebral infarction without residual deficits: Secondary | ICD-10-CM

## 2022-03-14 DIAGNOSIS — R402142 Coma scale, eyes open, spontaneous, at arrival to emergency department: Secondary | ICD-10-CM | POA: Diagnosis present

## 2022-03-14 DIAGNOSIS — S12190A Other displaced fracture of second cervical vertebra, initial encounter for closed fracture: Secondary | ICD-10-CM | POA: Diagnosis not present

## 2022-03-14 DIAGNOSIS — S3992XA Unspecified injury of lower back, initial encounter: Secondary | ICD-10-CM | POA: Diagnosis not present

## 2022-03-14 DIAGNOSIS — Z7902 Long term (current) use of antithrombotics/antiplatelets: Secondary | ICD-10-CM

## 2022-03-14 DIAGNOSIS — M436 Torticollis: Secondary | ICD-10-CM | POA: Diagnosis present

## 2022-03-14 DIAGNOSIS — R9431 Abnormal electrocardiogram [ECG] [EKG]: Secondary | ICD-10-CM | POA: Diagnosis not present

## 2022-03-14 DIAGNOSIS — M25571 Pain in right ankle and joints of right foot: Secondary | ICD-10-CM | POA: Diagnosis not present

## 2022-03-14 DIAGNOSIS — S066XAA Traumatic subarachnoid hemorrhage with loss of consciousness status unknown, initial encounter: Secondary | ICD-10-CM | POA: Diagnosis not present

## 2022-03-14 DIAGNOSIS — S199XXA Unspecified injury of neck, initial encounter: Secondary | ICD-10-CM | POA: Diagnosis not present

## 2022-03-14 DIAGNOSIS — S066X0A Traumatic subarachnoid hemorrhage without loss of consciousness, initial encounter: Secondary | ICD-10-CM | POA: Diagnosis not present

## 2022-03-14 DIAGNOSIS — Z820 Family history of epilepsy and other diseases of the nervous system: Secondary | ICD-10-CM

## 2022-03-14 DIAGNOSIS — M79605 Pain in left leg: Secondary | ICD-10-CM | POA: Diagnosis not present

## 2022-03-14 DIAGNOSIS — M25512 Pain in left shoulder: Secondary | ICD-10-CM | POA: Diagnosis present

## 2022-03-14 DIAGNOSIS — R402362 Coma scale, best motor response, obeys commands, at arrival to emergency department: Secondary | ICD-10-CM | POA: Diagnosis present

## 2022-03-14 DIAGNOSIS — Z8616 Personal history of COVID-19: Secondary | ICD-10-CM | POA: Diagnosis not present

## 2022-03-14 DIAGNOSIS — I341 Nonrheumatic mitral (valve) prolapse: Secondary | ICD-10-CM | POA: Diagnosis present

## 2022-03-14 DIAGNOSIS — S06309A Unspecified focal traumatic brain injury with loss of consciousness of unspecified duration, initial encounter: Secondary | ICD-10-CM | POA: Diagnosis present

## 2022-03-14 DIAGNOSIS — S82301A Unspecified fracture of lower end of right tibia, initial encounter for closed fracture: Secondary | ICD-10-CM | POA: Diagnosis not present

## 2022-03-14 DIAGNOSIS — Z888 Allergy status to other drugs, medicaments and biological substances status: Secondary | ICD-10-CM | POA: Diagnosis not present

## 2022-03-14 DIAGNOSIS — L89819 Pressure ulcer of head, unspecified stage: Secondary | ICD-10-CM | POA: Diagnosis not present

## 2022-03-14 DIAGNOSIS — R402252 Coma scale, best verbal response, oriented, at arrival to emergency department: Secondary | ICD-10-CM | POA: Diagnosis present

## 2022-03-14 DIAGNOSIS — J9811 Atelectasis: Secondary | ICD-10-CM | POA: Diagnosis not present

## 2022-03-14 DIAGNOSIS — I609 Nontraumatic subarachnoid hemorrhage, unspecified: Principal | ICD-10-CM

## 2022-03-14 DIAGNOSIS — R55 Syncope and collapse: Secondary | ICD-10-CM | POA: Diagnosis not present

## 2022-03-14 DIAGNOSIS — R079 Chest pain, unspecified: Secondary | ICD-10-CM | POA: Diagnosis not present

## 2022-03-14 DIAGNOSIS — S82291A Other fracture of shaft of right tibia, initial encounter for closed fracture: Secondary | ICD-10-CM | POA: Diagnosis not present

## 2022-03-14 LAB — COMPREHENSIVE METABOLIC PANEL
ALT: 33 U/L (ref 0–44)
AST: 43 U/L — ABNORMAL HIGH (ref 15–41)
Albumin: 3.6 g/dL (ref 3.5–5.0)
Alkaline Phosphatase: 47 U/L (ref 38–126)
Anion gap: 8 (ref 5–15)
BUN: 12 mg/dL (ref 8–23)
CO2: 24 mmol/L (ref 22–32)
Calcium: 9.2 mg/dL (ref 8.9–10.3)
Chloride: 107 mmol/L (ref 98–111)
Creatinine, Ser: 0.87 mg/dL (ref 0.44–1.00)
GFR, Estimated: >60 mL/min (ref >60)
Glucose, Bld: 185 mg/dL — ABNORMAL HIGH (ref 70–99)
Potassium: 3.9 mmol/L (ref 3.5–5.1)
Sodium: 139 mmol/L (ref 135–145)
Total Bilirubin: 0.5 mg/dL (ref 0.3–1.2)
Total Protein: 6.4 g/dL — ABNORMAL LOW (ref 6.5–8.1)

## 2022-03-14 LAB — I-STAT CHEM 8, ED
BUN: 13 mg/dL (ref 8–23)
Calcium, Ion: 1.17 mmol/L (ref 1.15–1.40)
Chloride: 105 mmol/L (ref 98–111)
Creatinine, Ser: 0.6 mg/dL (ref 0.44–1.00)
Glucose, Bld: 184 mg/dL — ABNORMAL HIGH (ref 70–99)
HCT: 40 % (ref 36.0–46.0)
Hemoglobin: 13.6 g/dL (ref 12.0–15.0)
Potassium: 3.8 mmol/L (ref 3.5–5.1)
Sodium: 140 mmol/L (ref 135–145)
TCO2: 26 mmol/L (ref 22–32)

## 2022-03-14 LAB — CBC
HCT: 40.2 % (ref 36.0–46.0)
Hemoglobin: 13.2 g/dL (ref 12.0–15.0)
MCH: 32.5 pg (ref 26.0–34.0)
MCHC: 32.8 g/dL (ref 30.0–36.0)
MCV: 99 fL (ref 80.0–100.0)
Platelets: 226 10*3/uL (ref 150–400)
RBC: 4.06 MIL/uL (ref 3.87–5.11)
RDW: 13.6 % (ref 11.5–15.5)
WBC: 9.4 10*3/uL (ref 4.0–10.5)
nRBC: 0 % (ref 0.0–0.2)

## 2022-03-14 LAB — ETHANOL: Alcohol, Ethyl (B): <10 mg/dL (ref <10)

## 2022-03-14 MED ORDER — IOHEXOL 300 MG/ML  SOLN
100.0000 mL | Freq: Once | INTRAMUSCULAR | Status: AC | PRN
  Administered 2022-03-14: 100 mL via INTRAVENOUS

## 2022-03-14 MED ORDER — LACTATED RINGERS IV SOLN: INTRAVENOUS | Status: DC

## 2022-03-14 MED ORDER — SIMETHICONE 80 MG PO CHEW: 80.0000 mg | CHEWABLE_TABLET | Freq: Four times a day (QID) | ORAL | Status: DC | PRN

## 2022-03-14 MED ORDER — METHOCARBAMOL 1000 MG/10ML IJ SOLN: 500.0000 mg | Freq: Four times a day (QID) | INTRAVENOUS | Status: DC | PRN

## 2022-03-14 MED ORDER — DOCUSATE SODIUM 100 MG PO CAPS
100.0000 mg | ORAL_CAPSULE | Freq: Two times a day (BID) | ORAL | Status: DC
  Administered 2022-03-15 – 2022-03-17 (×5): 100 mg via ORAL
  Filled 2022-03-14 (×5): qty 1

## 2022-03-14 MED ORDER — ACETAMINOPHEN 325 MG PO TABS
650.0000 mg | ORAL_TABLET | Freq: Four times a day (QID) | ORAL | Status: DC
  Administered 2022-03-14 – 2022-03-15 (×2): 650 mg via ORAL
  Filled 2022-03-14 (×2): qty 2

## 2022-03-14 MED ORDER — GABAPENTIN 300 MG PO CAPS
300.0000 mg | ORAL_CAPSULE | Freq: Three times a day (TID) | ORAL | Status: DC
  Administered 2022-03-14 – 2022-03-17 (×8): 300 mg via ORAL
  Filled 2022-03-14 (×8): qty 1

## 2022-03-14 MED ORDER — PROCHLORPERAZINE EDISYLATE 10 MG/2ML IJ SOLN: 10.0000 mg | INTRAMUSCULAR | Status: DC | PRN

## 2022-03-14 MED ORDER — ONDANSETRON HCL 4 MG/2ML IJ SOLN: 4.0000 mg | Freq: Four times a day (QID) | INTRAMUSCULAR | Status: DC | PRN

## 2022-03-14 MED ORDER — GABAPENTIN 600 MG PO TABS: 300.0000 mg | ORAL_TABLET | Freq: Three times a day (TID) | ORAL | Status: DC

## 2022-03-14 MED ORDER — HYDROMORPHONE HCL 1 MG/ML IJ SOLN: 0.5000 mg | INTRAMUSCULAR | Status: DC | PRN

## 2022-03-14 MED ORDER — OXYCODONE HCL 5 MG PO TABS: 5.0000 mg | ORAL_TABLET | ORAL | Status: DC | PRN
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
## 2022-03-14 NOTE — Progress Notes (Signed)
Orthopedic Tech Progress Note Patient Details:  Theresa [name] 01-13-1933 [id number]  Patient ID: Theresa [name], female   DOB: 1933-01-11, 88 y.o.   MRN: [id number] Patient refused cam boot. Said they had one at home and someone will bring it tomorrow. [name] 03/14/2022, 11:28 PM
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
## 2022-03-14 NOTE — ED Notes (Signed)
Pt states she is ready to go.  Cannot remember seeing any Dr.  Fidela Juneau headache.  PA at bedside updating pt.
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
## 2022-03-15 ENCOUNTER — Inpatient Hospital Stay (HOSPITAL_COMMUNITY): Payer: Medicare HMO

## 2022-03-15 ENCOUNTER — Encounter (HOSPITAL_COMMUNITY): Payer: Self-pay

## 2022-03-15 LAB — BASIC METABOLIC PANEL
Anion gap: 9 (ref 5–15)
BUN: 7 mg/dL — ABNORMAL LOW (ref 8–23)
CO2: 24 mmol/L (ref 22–32)
Calcium: 8.3 mg/dL — ABNORMAL LOW (ref 8.9–10.3)
Chloride: 107 mmol/L (ref 98–111)
Creatinine, Ser: 0.73 mg/dL (ref 0.44–1.00)
GFR, Estimated: >60 mL/min (ref >60)
Glucose, Bld: 109 mg/dL — ABNORMAL HIGH (ref 70–99)
Potassium: 3.7 mmol/L (ref 3.5–5.1)
Sodium: 140 mmol/L (ref 135–145)

## 2022-03-15 LAB — CBC
HCT: 37.4 % (ref 36.0–46.0)
Hemoglobin: 12.2 g/dL (ref 12.0–15.0)
MCH: 32.6 pg (ref 26.0–34.0)
MCHC: 32.6 g/dL (ref 30.0–36.0)
MCV: 100 fL (ref 80.0–100.0)
Platelets: 217 10*3/uL (ref 150–400)
RBC: 3.74 MIL/uL — ABNORMAL LOW (ref 3.87–5.11)
RDW: 13.9 % (ref 11.5–15.5)
WBC: 9.2 10*3/uL (ref 4.0–10.5)
nRBC: 0 % (ref 0.0–0.2)

## 2022-03-15 MED ORDER — METHOCARBAMOL 500 MG PO TABS
1000.0000 mg | ORAL_TABLET | Freq: Three times a day (TID) | ORAL | Status: DC
  Administered 2022-03-15 – 2022-03-17 (×7): 1000 mg via ORAL
  Filled 2022-03-15 (×7): qty 2

## 2022-03-15 MED ORDER — HYDROMORPHONE HCL 1 MG/ML IJ SOLN: 0.2500 mg | INTRAMUSCULAR | Status: DC | PRN

## 2022-03-15 MED ORDER — OXYCODONE HCL 5 MG PO TABS: 2.5000 mg | ORAL_TABLET | ORAL | Status: DC | PRN

## 2022-03-15 MED ORDER — ACETAMINOPHEN 500 MG PO TABS
1000.0000 mg | ORAL_TABLET | Freq: Four times a day (QID) | ORAL | Status: DC
  Administered 2022-03-15 – 2022-03-17 (×7): 1000 mg via ORAL
  Filled 2022-03-15 (×8): qty 2

## 2022-03-15 MED ORDER — KETOROLAC TROMETHAMINE 15 MG/ML IJ SOLN
15.0000 mg | Freq: Four times a day (QID) | INTRAMUSCULAR | Status: DC
  Administered 2022-03-15 – 2022-03-17 (×8): 15 mg via INTRAVENOUS
  Filled 2022-03-15 (×9): qty 1
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
## 2022-03-15 NOTE — ED Notes (Signed)
..  Trauma Event Note    Reviewed pt consults, NS NP recommend repeat head CT 6-8. No order found, new head CT ordered per recommendations.   Last imported Vital Signs BP (!) 105/59   Pulse 71   Temp 98.8 F (37.1 C) (Oral)   Resp 16   Ht '4\' 11"'$  (1.499 m)   Wt 139 lb 15.9 oz (63.5 kg)   SpO2 92%   BMI 28.27 kg/m   Trending CBC Recent Labs    03/14/22 1824 03/14/22 1903 03/15/22 0446  WBC 9.4  --  9.2  HGB 13.2 13.6 12.2  HCT 40.2 40.0 37.4  PLT 226  --  217    Trending Coag's No results for input(s): "APTT", "INR" in the last 72 hours.  Trending BMET Recent Labs    03/14/22 1828 03/14/22 1903  NA 139 140  K 3.9 3.8  CL 107 105  CO2 24  --   BUN 12 13  CREATININE 0.87 0.60  GLUCOSE 185* 184*      Theresa [name] Dee  Trauma Response RN  Please call TRN at [phone] for further assistance.

## 2022-03-15 NOTE — Consult Note (Signed)
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 Doreatha Martin as outpatient as she has been doing  Armond Hang, MD Orthopaedic Surgeon [facility] [phone]

## 2022-03-15 NOTE — ED Notes (Signed)
Patient back in room from CT.

## 2022-03-15 NOTE — ED Notes (Signed)
Patient transported to MRI 

## 2022-03-15 NOTE — ED Notes (Signed)
Pt placed on hospital bed and linens changed

## 2022-03-15 NOTE — ED Notes (Signed)
Patient transported to CT 

## 2022-03-16 ENCOUNTER — Inpatient Hospital Stay (HOSPITAL_COMMUNITY): Payer: Medicare HMO

## 2022-03-16 DIAGNOSIS — S12110A Anterior displaced Type II dens fracture, initial encounter for closed fracture: Secondary | ICD-10-CM | POA: Diagnosis present

## 2022-03-16 LAB — CBC
HCT: 39.3 % (ref 36.0–46.0)
Hemoglobin: 12.6 g/dL (ref 12.0–15.0)
MCH: 32.6 pg (ref 26.0–34.0)
MCHC: 32.1 g/dL (ref 30.0–36.0)
MCV: 101.6 fL — ABNORMAL HIGH (ref 80.0–100.0)
Platelets: 198 10*3/uL (ref 150–400)
RBC: 3.87 MIL/uL (ref 3.87–5.11)
RDW: 14.2 % (ref 11.5–15.5)
WBC: 7.3 10*3/uL (ref 4.0–10.5)
nRBC: 0 % (ref 0.0–0.2)

## 2022-03-16 LAB — BASIC METABOLIC PANEL
Anion gap: 9 (ref 5–15)
BUN: 11 mg/dL (ref 8–23)
CO2: 22 mmol/L (ref 22–32)
Calcium: 8 mg/dL — ABNORMAL LOW (ref 8.9–10.3)
Chloride: 108 mmol/L (ref 98–111)
Creatinine, Ser: 0.63 mg/dL (ref 0.44–1.00)
GFR, Estimated: >60 mL/min (ref >60)
Glucose, Bld: 103 mg/dL — ABNORMAL HIGH (ref 70–99)
Potassium: 3.8 mmol/L (ref 3.5–5.1)
Sodium: 139 mmol/L (ref 135–145)
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
## 2022-03-16 NOTE — ED Notes (Signed)
Pt requesting food. RN explained that lunch trays are going around right now and that RN cannot grab a tray from the cart to give to pt. RN offered Pt a [place] sandwich and snacks. Pt refused snacks and sandwich.

## 2022-03-16 NOTE — ED Notes (Signed)
RN offered pt to warm up breakfast, pt refused.
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
## 2022-03-16 NOTE — Progress Notes (Signed)
SLP Cancellation Note  Patient Details Name: Theresa [name] MRN: [id number] DOB: Jan 28, 1933   Cancelled treatment:       Reason Eval/Treat Not Completed: Patient at procedure or test/unavailable.   Unable to complete cognitive linguistic evaluation at this time, as pt is currently with PT/OT. Will continue efforts.   Micai Apolinar B. Quentin Ore, [facility], [place] Speech Language Pathologist Office: [phone]  Shonna Chock 03/16/2022, 2:31 PM

## 2022-03-17 ENCOUNTER — Other Ambulatory Visit (HOSPITAL_COMMUNITY): Payer: Self-pay

## 2022-03-17 LAB — BASIC METABOLIC PANEL
Anion gap: 7 (ref 5–15)
BUN: 15 mg/dL (ref 8–23)
CO2: 23 mmol/L (ref 22–32)
Calcium: 8.6 mg/dL — ABNORMAL LOW (ref 8.9–10.3)
Chloride: 106 mmol/L (ref 98–111)
Creatinine, Ser: 0.67 mg/dL (ref 0.44–1.00)
GFR, Estimated: >60 mL/min (ref >60)
Glucose, Bld: 121 mg/dL — ABNORMAL HIGH (ref 70–99)
Potassium: 4.1 mmol/L (ref 3.5–5.1)
Sodium: 136 mmol/L (ref 135–145)

## 2022-03-17 LAB — CBC
HCT: 34.3 % — ABNORMAL LOW (ref 36.0–46.0)
Hemoglobin: 11.7 g/dL — ABNORMAL LOW (ref 12.0–15.0)
MCH: 33.1 pg (ref 26.0–34.0)
MCHC: 34.1 g/dL (ref 30.0–36.0)
MCV: 97.2 fL (ref 80.0–100.0)
Platelets: 204 10*3/uL (ref 150–400)
RBC: 3.53 MIL/uL — ABNORMAL LOW (ref 3.87–5.11)
RDW: 13.9 % (ref 11.5–15.5)
WBC: 7.2 10*3/uL (ref 4.0–10.5)
nRBC: 0 % (ref 0.0–0.2)

## 2022-03-17 MED ORDER — GABAPENTIN 300 MG PO CAPS
300.0000 mg | ORAL_CAPSULE | Freq: Three times a day (TID) | ORAL | 0 refills | Status: DC
  Filled 2022-03-17: qty 90, 30d supply, fill #0

## 2022-03-17 MED ORDER — LEVETIRACETAM 500 MG PO TABS
500.0000 mg | ORAL_TABLET | Freq: Two times a day (BID) | ORAL | 0 refills | Status: AC
  Filled 2022-03-17: qty 14, 7d supply, fill #0

## 2022-03-17 MED ORDER — LEVETIRACETAM 500 MG PO TABS
500.0000 mg | ORAL_TABLET | Freq: Two times a day (BID) | ORAL | Status: DC
Start: 2022-03-17 — End: 2022-03-17
  Administered 2022-03-17: 500 mg via ORAL
  Filled 2022-03-17: qty 1

## 2022-03-17 MED ORDER — METHOCARBAMOL 500 MG PO TABS
1000.0000 mg | ORAL_TABLET | Freq: Four times a day (QID) | ORAL | 0 refills | Status: DC | PRN
  Filled 2022-03-17: qty 40, 5d supply, fill #0

## 2022-03-17 MED ORDER — ACETAMINOPHEN 500 MG PO TABS
1000.0000 mg | ORAL_TABLET | Freq: Four times a day (QID) | ORAL | 0 refills | Status: AC
  Filled 2022-03-17: qty 30, 4d supply, fill #0

## 2022-03-17 MED ORDER — METHOCARBAMOL 500 MG PO TABS
500.0000 mg | ORAL_TABLET | Freq: Four times a day (QID) | ORAL | 0 refills | Status: AC | PRN
  Filled 2022-03-17: qty 40, 10d supply, fill #0

## 2022-03-17 NOTE — Progress Notes (Signed)
Nsg Discharge Note  Admit Date:  03/14/2022 Discharge date: 03/17/2022   ONEKA PARADA to be D/C'd Home per MD order.  AVS completed. Patient/caregiver able to verbalize understanding.  Discharge Medication: Allergies as of 03/17/2022       Reactions   Amitriptyline Hcl    Elevation in muscle enzymes   Clindamycin/lincomycin    Causes esophagitis   Zithromax [azithromycin] Rash   Capsaicin    Pt does not remember    Ceclor [cefaclor] Nausea Only   Shellfish Allergy Rash   Lobster Only - made pt sick, caused blisters          Medication List     STOP taking these medications    clopidogrel 75 MG tablet Commonly known as: Plavix       TAKE these medications    acetaminophen 500 MG tablet Commonly known as: TYLENOL Take 2 tablets (1,000 mg total) by mouth every 6 (six) hours.   albuterol 108 (90 Base) MCG/ACT inhaler Commonly known as: VENTOLIN HFA Inhale 1-2 puffs into the lungs every 4 (four) hours as needed for wheezing or shortness of breath.   atorvastatin 20 MG tablet Commonly known as: LIPITOR Take 20 mg by mouth every evening.   b complex vitamins capsule Take 1 capsule by mouth daily.   baclofen 10 MG tablet Commonly known as: LIORESAL Take 10 mg by mouth at bedtime as needed for muscle spasms.   Black Pepper-Turmeric 3-500 MG Caps Take 1,500 mg by mouth daily.   Cholecalciferol 25 MCG (1000 UT) tablet Take 1,000 Units by mouth daily.   cyanocobalamin 1000 MCG tablet Commonly known as: VITAMIN B12 Take 1,000 mcg by mouth daily.   gabapentin 300 MG capsule Commonly known as: NEURONTIN Take 1 capsule (300 mg total) by mouth 3 (three) times daily.   GLUCOSAMINE CHONDROITIN JOINT PO Take 30 mLs by mouth daily.   levETIRAcetam 500 MG tablet Commonly known as: KEPPRA Take 1 tablet (500 mg total) by mouth 2 (two) times daily for 7 days.   methocarbamol 500 MG tablet Commonly known as: ROBAXIN Take 1 tablet (500 mg total) by mouth every 6  (six) hours as needed for muscle spasms.   naproxen sodium 220 MG tablet Commonly known as: ALEVE Take 220 mg by mouth at bedtime as needed (pain).   Prolia 60 MG/ML Sosy injection Generic drug: denosumab Inject 60 mg into the skin every 6 (six) months.        Discharge Assessment: Vitals:   03/17/22 0500 03/17/22 1102  BP:  121/66  Pulse: (!) 103 79  Resp:  18  Temp:  97.7 F (36.5 C)  SpO2:  95%   Skin clean, dry and intact without evidence of skin break down, no evidence of skin tears noted. IV catheter discontinued intact. Site without signs and symptoms of complications - no redness or edema noted at insertion site, patient denies c/o pain - only slight tenderness at site.  Dressing with slight pressure applied.  D/c Instructions-Education: Discharge instructions given to patient/family with verbalized understanding. D/c education completed with patient/family including follow up instructions, medication list, d/c activities limitations if indicated, with other d/c instructions as indicated by MD - patient able to verbalize understanding, all questions fully answered. Patient instructed to return to ED, call 911, or call MD for any changes in condition.  Patient escorted via [place], and D/C home via private auto.  Atilano Ina, RN 03/17/2022 1:57 PM

## 2022-03-17 NOTE — Discharge Instructions (Signed)
-  Keppra 500 mg PO BID x 7 days given LOC, small intracranial hemorrhage -Hard cervical collar at all times x 3 months  -Outpatient follow up with cervical radiographs
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
## 2022-03-17 NOTE — TOC CAGE-AID Note (Signed)
Transition of Care [facility]) - CAGE-AID Screening   Patient Details  Name: Theresa [name] MRN: [id number] Date of Birth: May 03, 1933  Transition of Care (TOC) CM/SW Contact:    Army Melia, RN Phone Number:[phone] 03/17/2022, 5:12 AM     CAGE-AID Screening:    Have You Ever Felt You Ought to Cut Down on Your Drinking or Drug Use?: No Have People Annoyed You By Critizing Your Drinking Or Drug Use?: No Have You Felt Bad Or Guilty About Your Drinking Or Drug Use?: No Have You Ever Had a Drink or Used Drugs First Thing In The Morning to Steady Your Nerves or to Get Rid of a Hangover?: No CAGE-AID Score: 0  Substance Abuse Education Offered: No (no hx of drug or alcohol use, no resources indicated)

## 2022-03-19 DIAGNOSIS — M542 Cervicalgia: Secondary | ICD-10-CM | POA: Diagnosis not present

## 2022-03-19 DIAGNOSIS — S12101A Unspecified nondisplaced fracture of second cervical vertebra, initial encounter for closed fracture: Secondary | ICD-10-CM | POA: Diagnosis not present

## 2022-03-19 DIAGNOSIS — M8088XA Other osteoporosis with current pathological fracture, vertebra(e), initial encounter for fracture: Secondary | ICD-10-CM | POA: Diagnosis not present

## 2022-03-25 IMAGING — US IR TRANSCATH EMBOLIZATION
1 series · 1 of 1 positions shown · non-contrast
Comparison: MRI MRA of the brain June 20, 2021.

CLINICAL DATA: History of headaches and falls. MRI and MRA of the
brain reveal approximately 6 mm x 5 mm anterior communicating artery
aneurysm.

EXAM:
TRANSCATHETER THERAPY EMBOLIZATION
TECHNIQUE: Informed written consent was obtained from the patient after a
thorough discussion of the procedural risks, benefits and
alternatives. All questions were addressed. Maximal Sterile Barrier
Technique was utilized including caps, mask, sterile gowns, sterile
gloves, sterile drape, hand hygiene and skin antiseptic. A timeout
was performed prior to the initiation of the procedure.

[Series 1: ir (id) (id)/(id) · 1 of 1 slices shown]
[im 1/1]
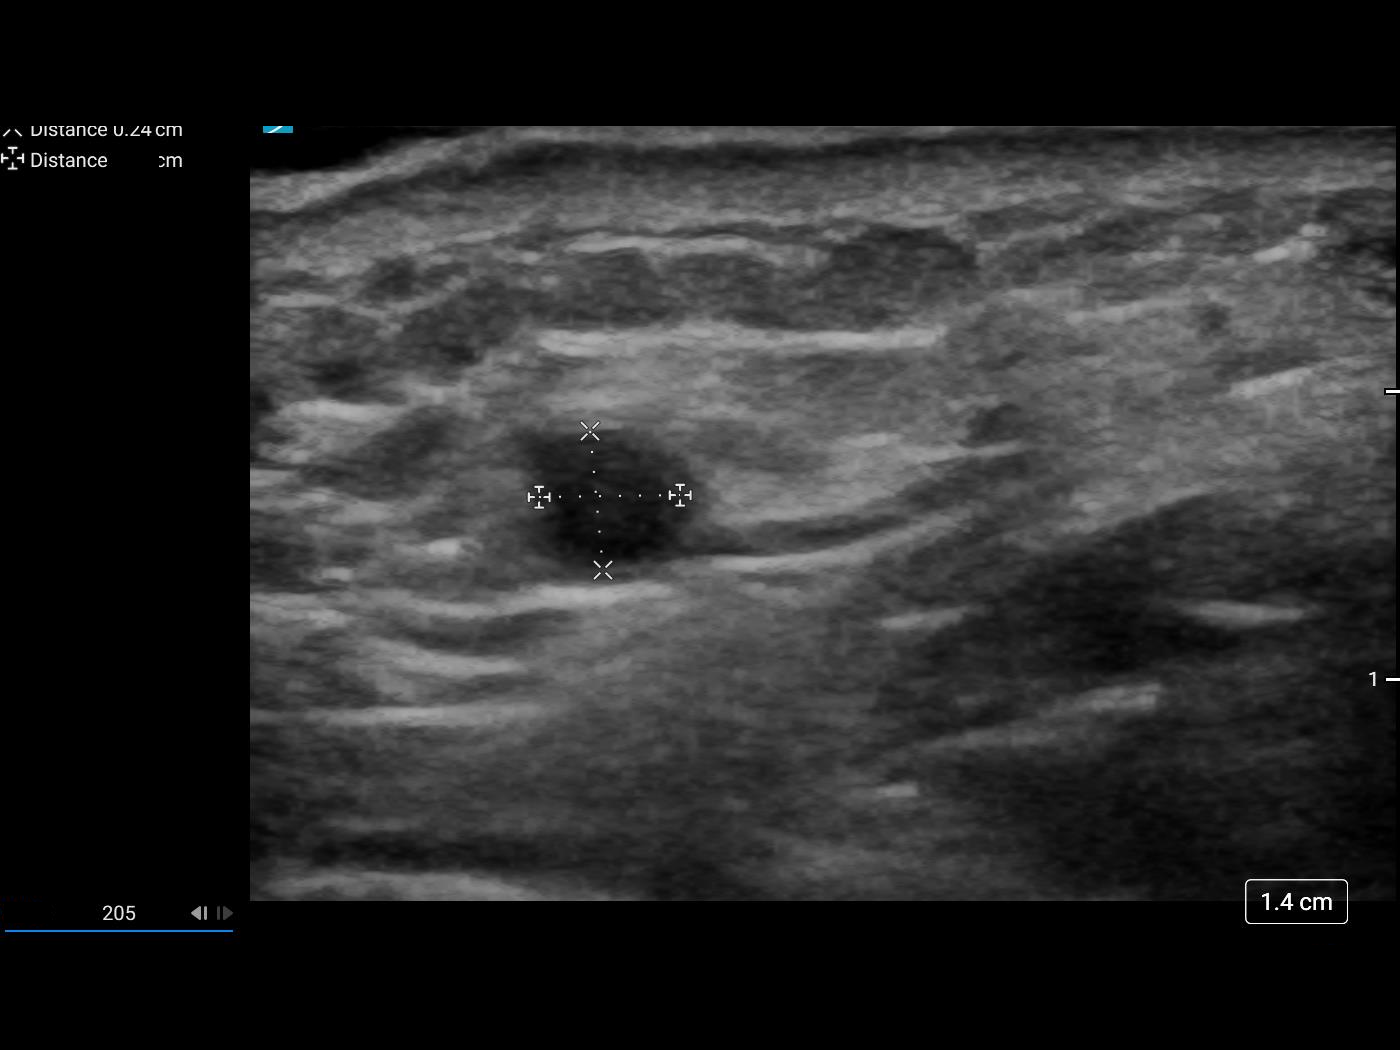

[1 of 1 positions shown; findings below may reference images not displayed]

MEDICATIONS:
Heparin 5,000 units IV. Ancef 2 g IV antibiotic was administered
within 1 hour of the procedure.

ANESTHESIA/SEDATION:
Endotracheal general anesthesia.

CONTRAST:  Omnipaque 300 approximately 200 mL.

FLUOROSCOPY TIME:  Fluoroscopy Time: 52 minutes 0 seconds 2359 mGy).

COMPLICATIONS:
None immediate.
The right groin was prepped and draped in the usual sterile fashion.
Thereafter using modified Seldinger technique, transfemoral access
into the right common femoral artery was obtained without
difficulty. Over a 0.035 inch guidewire, an 8 French 25 cm Pinnacle
sheath was inserted. Through this, and also over 0.035 inch
guidewire, a 5 French JB 1 catheter was advanced to the aortic arch
region and selectively positioned in the right common carotid
artery, the right subclavian artery, the left common carotid artery
and the left vertebral artery.
FINDINGS: The right subclavian arteriogram demonstrates mild stenosis at the
origin of the left vertebral artery.

The vessel is, otherwise, seen to opacify to the cranial skull base.
Wide patency is seen of the left vertebrobasilar junction and the
left posterior-inferior cerebellar artery. Patency is seen of the
basilar artery in the lateral projection with opacification of the
ipsilateral left posterior cerebral artery. Unopacified blood is
seen in the basilar artery from the contralateral vertebral artery.

The left common carotid arteriogram demonstrates mild tortuosity
proximally.

The left external carotid artery and its major branches are widely
patent.

The left internal carotid artery at the bulb to the cranial skull
base demonstrates mild caliber irregularity in its mid [DATE]. More
distally, the vessel is seen to opacify to the cranial skull base.
The petrous, the cavernous and the supraclinoid segments are widely
patent. Left posterior communicating artery is seen opacifying the
left posterior cerebral artery distribution.

The left middle cerebral artery opacifies into the capillary and
venous phases.

The left anterior cerebral artery A1 segment is patent with flow
noted into the distal left A2 segment and beyond.

The right subclavian arteriogram demonstrates patency of the right
vertebral artery proximally and distally. Its origin is poorly
visualized due to the overlying surgical fusion apparatus.

More distally, the vessel is seen to opacify to the cranial skull
base and eventually the right posterior-inferior cerebellar artery.

The right common carotid arteriogram demonstrates the right external
carotid artery and its major branches to be widely patent.

The right internal carotid artery at the bulb to the cranial skull
base is widely patent patent. The petrous, the cavernous and the
supraclinoid segments are widely patent.

The right anterior cerebral artery and the right middle cerebral
artery opacify into the capillary and venous phases.

Cross-filling via the anterior communicating artery of the left
anterior cerebral artery A2 segment and distally is noted with
mixing of unopacified blood from the contralateral left internal
carotid artery as described above.

Arising in the anterior communicating artery is a saccular aneurysm
projecting inferiorly and anteriorly.

A 3D rotational arteriogram performed with 3D reformations confirms
the presence of a 6.4 mm x 6.1 mm in the AP projection, and a 5.8 x
5.9 mm in the lateral projection aneurysm.

ENDOVASCULAR EMBOLIZATION OF ANTERIOR COMMUNICATING ARTERY ANEURYSM
USING THE WEB INTRA SACCULAR FLOW DISRUPTER DEVICE

The diagnostic JB 1 catheter in the distal right common carotid
artery was then exchanged over a 0.035 inch 300 cm Rosen exchange
guidewire for a 90 cm 8 French Neuron Max sheath. The guidewire was
removed. Good aspiration obtained from the hub of the Neuron Max
sheath. Gentle control arteriogram performed through this
demonstrated no change in the extracranial or intracranial
circulations.

Over a 0.035 inch regular Glidewire, a 115 cm 5 French Navien guide
catheter was then advanced to the petrous segment. The guidewire was
removed. Good aspiration obtained from the hub of the Navien guide
catheter. A gentle control arteriogram performed through this
demonstrated no vasospasm or intraluminal filling defects. At this
time the Neuron Max sheath was advanced into the distal cervical
right ICA.

Over a 0.014 inch Aristotle soft tip micro guidewire, a Via 17 tip
marker microcatheter was then advanced to the right M1 segment.
Micro guidewire was then advanced into the aneurysm followed by the
microcatheter which was positioned in the proximal fundus of the
aneurysm. Guidewire was removed. Good aspiration obtained from the
hub of the microcatheter. A gentle control arteriogram performed
through this demonstrates safe positioning of tip of the
microcatheter. This was connected to continuous heparinized saline
infusion.

Initially, it was decided to proceed with a 7-4 WEB SL device. This
was prepped and purged in heparinized saline infusion submerged.

Thereafter, the device was advanced to the distal end of the
microcatheter using biplane roadmap technique and constant
heparinized saline infusion.

Distal device was aligned with the distal tip of the microcatheter.

The system was then released of tension. Under biplane intermittent
fluoroscopy and roadmap technique, the device was initially gently
introduced into the fundus of the aneurysm until a flower
configuration was obtained. Thereafter, device was uploaded into the
aneurysm to conform to the shape of the aneurysm.

However, the proximal portion of the device failed to open despite
multiple attempts. This device was felt to be probably slightly too
big for the aneurysm. This was removed. A 7-4 WEB SL device was then
prepped and purged under heparinized saline infusion and retrieved
into the delivery housing.

Thereafter, this was advanced to the intra aneurysmal microcatheter
with the distal marker aligned with the distal marker on the
microcatheter.

Thereafter, the device was gently advanced into the fundus of the
aneurysm until a flower configuration was obtained.

Thereafter, with gentle manipulation using the microcatheter, and
the device, the device opened up nicely to conform to the shape of
the aneurysm.

A control arteriogram performed through the 5 French Navien guide
catheter in the right internal carotid artery demonstrated excellent
apposition of the device and the aneurysm covering the entire
aneurysm in the AP and lateral projection and also in the neck
region. Free fluid was noted in the right anterior cerebral artery
A1 A2 and distally, and the left A2 segment.

At this time, the microcatheter was gently retrieved whilst
maintaining the device and its confirmation. Once adequate space had
been formed between the proximal marker and the distal marker on the
microcatheter, the device was then disengaged under fluoroscopic
guidance. The microcatheter was then gently advanced to be in
contact with the proximal marker on the device. With the device
being held in stable condition, the microcatheter was then gently
retrieved under fluoroscopic guidance with the proximal marker on
the device being stable.

Control arteriogram performed through the 5 French Navien guide
catheter in the right internal carotid artery demonstrated excellent
coverage at the neck with free flow noted into both anterior
cerebral arteries. Stasis was noted within the aneurysm itself.
Follow-up arteriograms were obtained at 15 and 30 minutes post
deployment of the device which continued to demonstrate stasis
within the aneurysm with no change in the confirmation of the device
in the aneurysm.

The flow was maintained in both anterior cerebral arteries without
evidence of intraluminal filling defects.

Patient was given a total of 4.5 mg of Integrilin intra-arterially
in order to prevent formation of platelet aggregation at the site of
the device proximally.

None was observed.

With constant fluoroscopic guidance, the Navien 5 French guide
catheter was removed. The Neuron Max sheath was retrieved into the
right common carotid artery. An arteriogram performed demonstrated
wide patency of the right internal carotid artery extra cranially
and intracranially. No evidence of intraluminal filling defects or
of occlusion was seen in the anterior cerebral and the middle
cerebral artery distributions.

The Neuron Max sheath was removed. The 5 French diagnostic catheter
was then advanced to the left common carotid artery. Arteriogram
performed through this demonstrated excellent flow in the left
internal carotid artery to the cranial skull base and the petrous,
cavernous and supraclinoid segments. The left middle cerebral artery
remained widely patent. The left anterior cerebral artery was seen
to be patent in the A1 and A2 segments distally.

The 8 French Pinnacle sheath was removed with successful hemostasis
at the right groin puncture site with an 8 French Angio-Seal closure
device. Distal pulses remained Dopplerable in both feet unchanged. A
flat panel CT of the brain demonstrated no evidence of intracranial
hemorrhage.

Patient's general anesthesia was reversed and the patient was then
extubated without difficulty. Upon recovery, the patient denied any
headaches, nausea or vomiting. She verbalized appropriately. The
pupils were 2-3 mm round regular sluggish. She was able to move all
fours equally unchanged from prior to the treatment.

She was then transferred to the PACU and then neuro ICU.

Later in the afternoon, the patient was fully awake, alert, oriented
to time, place, space and able to tolerated ice chips. She
complained of a bifrontal headache on a scale of [DATE] which
responded to Tylenol. Her right groin appeared soft at that time.

However, slight bleeding was noted in the right groin puncture site
after the patient had Belen Appleby of coughing.

This required placement of a quick clot of with manual pressure for
20 minutes. No evidence of a hematoma was noted. Distal pulses
remained Dopplerable in both feet unchanged.

A knee immobilizer was then placed in order to avoid any flexion or
extension at the hip. The following morning, the patient remained
intact neurologically without any headaches, nausea, vomiting or
neurological symptoms. Her right groin was soft without hematoma.
Her IV heparin was stopped and patient was started on aspirin 81 mg
a day, Plavix 75 mg a day.

She was then mobilized independently and then discharged home under
the care of her son. Instructions were given to the patient to avoid
stooping, bending or lifting weights over 10 pounds for 2 weeks. She
was advised not to drive for a couple of weeks. Also she was advised
to maintain adequate hydration and to continue with her home meds
and also with the anti-platelet regimen.

Patient expressed understanding and agreement with the above
management plan.
IMPRESSION: Status post endovascular embolization of anterior communicating
artery aneurysm with a 7-4 SL WEB flow disrupter device with
hemostasis.

PLAN:
Follow-up in the clinic in 2 weeks.

## 2022-03-30 DIAGNOSIS — S066X1D Traumatic subarachnoid hemorrhage with loss of consciousness of 30 minutes or less, subsequent encounter: Secondary | ICD-10-CM | POA: Diagnosis not present

## 2022-03-30 DIAGNOSIS — S12100D Unspecified displaced fracture of second cervical vertebra, subsequent encounter for fracture with routine healing: Secondary | ICD-10-CM | POA: Diagnosis not present

## 2022-03-30 DIAGNOSIS — Z6827 Body mass index (BMI) 27.0-27.9, adult: Secondary | ICD-10-CM | POA: Diagnosis not present

## 2022-03-31 DIAGNOSIS — S82391D Other fracture of lower end of right tibia, subsequent encounter for closed fracture with routine healing: Secondary | ICD-10-CM | POA: Diagnosis not present

## 2022-03-31 DIAGNOSIS — M79604 Pain in right leg: Secondary | ICD-10-CM | POA: Diagnosis not present
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
## 2022-04-02 DIAGNOSIS — M8088XA Other osteoporosis with current pathological fracture, vertebra(e), initial encounter for fracture: Secondary | ICD-10-CM | POA: Diagnosis not present

## 2022-04-02 DIAGNOSIS — M4322 Fusion of spine, cervical region: Secondary | ICD-10-CM | POA: Diagnosis not present

## 2022-04-02 DIAGNOSIS — S12101A Unspecified nondisplaced fracture of second cervical vertebra, initial encounter for closed fracture: Secondary | ICD-10-CM | POA: Diagnosis not present

## 2022-04-22 DIAGNOSIS — H3540 Unspecified peripheral retinal degeneration: Secondary | ICD-10-CM | POA: Diagnosis not present

## 2022-04-22 DIAGNOSIS — H43812 Vitreous degeneration, left eye: Secondary | ICD-10-CM | POA: Diagnosis not present

## 2022-04-28 DIAGNOSIS — S82391D Other fracture of lower end of right tibia, subsequent encounter for closed fracture with routine healing: Secondary | ICD-10-CM | POA: Diagnosis not present

## 2022-04-28 DIAGNOSIS — M79604 Pain in right leg: Secondary | ICD-10-CM | POA: Diagnosis not present

## 2022-04-30 DIAGNOSIS — M4322 Fusion of spine, cervical region: Secondary | ICD-10-CM | POA: Diagnosis not present

## 2022-04-30 DIAGNOSIS — M542 Cervicalgia: Secondary | ICD-10-CM | POA: Diagnosis not present

## 2022-04-30 DIAGNOSIS — M8088XA Other osteoporosis with current pathological fracture, vertebra(e), initial encounter for fracture: Secondary | ICD-10-CM | POA: Diagnosis not present

## 2022-04-30 DIAGNOSIS — S12101A Unspecified nondisplaced fracture of second cervical vertebra, initial encounter for closed fracture: Secondary | ICD-10-CM | POA: Diagnosis not present

## 2022-05-06 DIAGNOSIS — I609 Nontraumatic subarachnoid hemorrhage, unspecified: Secondary | ICD-10-CM | POA: Diagnosis not present

## 2022-05-06 DIAGNOSIS — Z23 Encounter for immunization: Secondary | ICD-10-CM | POA: Diagnosis not present

## 2022-05-06 DIAGNOSIS — S12100D Unspecified displaced fracture of second cervical vertebra, subsequent encounter for fracture with routine healing: Secondary | ICD-10-CM | POA: Diagnosis not present

## 2022-05-18 DIAGNOSIS — M9903 Segmental and somatic dysfunction of lumbar region: Secondary | ICD-10-CM | POA: Diagnosis not present

## 2022-05-18 DIAGNOSIS — M5137 Other intervertebral disc degeneration, lumbosacral region: Secondary | ICD-10-CM | POA: Diagnosis not present

## 2022-05-18 DIAGNOSIS — M9902 Segmental and somatic dysfunction of thoracic region: Secondary | ICD-10-CM | POA: Diagnosis not present

## 2022-05-18 DIAGNOSIS — M9905 Segmental and somatic dysfunction of pelvic region: Secondary | ICD-10-CM | POA: Diagnosis not present

## 2022-05-18 DIAGNOSIS — M5414 Radiculopathy, thoracic region: Secondary | ICD-10-CM | POA: Diagnosis not present

## 2022-05-18 DIAGNOSIS — M5417 Radiculopathy, lumbosacral region: Secondary | ICD-10-CM | POA: Diagnosis not present

## 2022-05-26 DIAGNOSIS — M5414 Radiculopathy, thoracic region: Secondary | ICD-10-CM | POA: Diagnosis not present

## 2022-05-26 DIAGNOSIS — M9903 Segmental and somatic dysfunction of lumbar region: Secondary | ICD-10-CM | POA: Diagnosis not present

## 2022-05-26 DIAGNOSIS — M5417 Radiculopathy, lumbosacral region: Secondary | ICD-10-CM | POA: Diagnosis not present

## 2022-05-26 DIAGNOSIS — M5137 Other intervertebral disc degeneration, lumbosacral region: Secondary | ICD-10-CM | POA: Diagnosis not present

## 2022-05-26 DIAGNOSIS — M9905 Segmental and somatic dysfunction of pelvic region: Secondary | ICD-10-CM | POA: Diagnosis not present

## 2022-05-26 DIAGNOSIS — M9902 Segmental and somatic dysfunction of thoracic region: Secondary | ICD-10-CM | POA: Diagnosis not present

## 2022-05-27 ENCOUNTER — Telehealth: Payer: Self-pay

## 2022-05-27 NOTE — Telephone Encounter (Signed)
Pre Op Assessment form received.

## 2022-05-28 DIAGNOSIS — M542 Cervicalgia: Secondary | ICD-10-CM | POA: Diagnosis not present

## 2022-05-28 DIAGNOSIS — S12101A Unspecified nondisplaced fracture of second cervical vertebra, initial encounter for closed fracture: Secondary | ICD-10-CM | POA: Diagnosis not present

## 2022-05-28 DIAGNOSIS — Z6828 Body mass index (BMI) 28.0-28.9, adult: Secondary | ICD-10-CM | POA: Diagnosis not present

## 2022-05-29 ENCOUNTER — Ambulatory Visit: Payer: Medicare HMO | Admitting: Neurology

## 2022-06-02 ENCOUNTER — Encounter: Payer: Self-pay | Admitting: Neurology

## 2022-06-02 ENCOUNTER — Ambulatory Visit: Payer: Medicare HMO | Admitting: Neurology

## 2022-06-02 VITALS — BP 135/72 | HR 79 | Ht 59.0 in | Wt 138.2 lb

## 2022-06-02 DIAGNOSIS — I671 Cerebral aneurysm, nonruptured: Secondary | ICD-10-CM | POA: Diagnosis not present

## 2022-06-02 DIAGNOSIS — G4486 Cervicogenic headache: Secondary | ICD-10-CM | POA: Diagnosis not present

## 2022-06-02 DIAGNOSIS — M47812 Spondylosis without myelopathy or radiculopathy, cervical region: Secondary | ICD-10-CM | POA: Diagnosis not present

## 2022-06-02 MED ORDER — GABAPENTIN 100 MG PO CAPS: ORAL_CAPSULE | ORAL | 0 refills | Status: AC

## 2022-06-02 NOTE — Patient Instructions (Signed)
Start gabapentin '100mg'$  at bedtime for one week, then increase to '200mg'$  at bedtime. Contact Dr. Arlean Hopping office about getting repeat brain imaging [phone] Follow up 4-5 months.
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
## 2022-06-15 DIAGNOSIS — Z0000 Encounter for general adult medical examination without abnormal findings: Secondary | ICD-10-CM | POA: Diagnosis not present

## 2022-06-15 DIAGNOSIS — E559 Vitamin D deficiency, unspecified: Secondary | ICD-10-CM | POA: Diagnosis not present

## 2022-06-15 DIAGNOSIS — R946 Abnormal results of thyroid function studies: Secondary | ICD-10-CM | POA: Diagnosis not present

## 2022-06-15 DIAGNOSIS — I700 Atherosclerosis of aorta: Secondary | ICD-10-CM | POA: Diagnosis not present

## 2022-06-15 DIAGNOSIS — J449 Chronic obstructive pulmonary disease, unspecified: Secondary | ICD-10-CM | POA: Diagnosis not present

## 2022-06-15 DIAGNOSIS — Z8673 Personal history of transient ischemic attack (TIA), and cerebral infarction without residual deficits: Secondary | ICD-10-CM | POA: Diagnosis not present

## 2022-06-15 DIAGNOSIS — M5136 Other intervertebral disc degeneration, lumbar region: Secondary | ICD-10-CM | POA: Diagnosis not present

## 2022-06-15 DIAGNOSIS — E785 Hyperlipidemia, unspecified: Secondary | ICD-10-CM | POA: Diagnosis not present

## 2022-06-15 DIAGNOSIS — M5030 Other cervical disc degeneration, unspecified cervical region: Secondary | ICD-10-CM | POA: Diagnosis not present

## 2022-06-15 DIAGNOSIS — G44209 Tension-type headache, unspecified, not intractable: Secondary | ICD-10-CM | POA: Diagnosis not present

## 2022-06-15 DIAGNOSIS — R1032 Left lower quadrant pain: Secondary | ICD-10-CM | POA: Diagnosis not present

## 2022-06-15 DIAGNOSIS — G629 Polyneuropathy, unspecified: Secondary | ICD-10-CM | POA: Diagnosis not present

## 2022-06-17 ENCOUNTER — Other Ambulatory Visit (HOSPITAL_COMMUNITY): Payer: Self-pay | Admitting: Interventional Radiology

## 2022-06-17 DIAGNOSIS — M5137 Other intervertebral disc degeneration, lumbosacral region: Secondary | ICD-10-CM | POA: Diagnosis not present

## 2022-06-17 DIAGNOSIS — M5414 Radiculopathy, thoracic region: Secondary | ICD-10-CM | POA: Diagnosis not present

## 2022-06-17 DIAGNOSIS — M5417 Radiculopathy, lumbosacral region: Secondary | ICD-10-CM | POA: Diagnosis not present

## 2022-06-17 DIAGNOSIS — M9902 Segmental and somatic dysfunction of thoracic region: Secondary | ICD-10-CM | POA: Diagnosis not present

## 2022-06-17 DIAGNOSIS — I671 Cerebral aneurysm, nonruptured: Secondary | ICD-10-CM

## 2022-06-17 DIAGNOSIS — M9903 Segmental and somatic dysfunction of lumbar region: Secondary | ICD-10-CM | POA: Diagnosis not present

## 2022-06-17 DIAGNOSIS — M9905 Segmental and somatic dysfunction of pelvic region: Secondary | ICD-10-CM | POA: Diagnosis not present

## 2022-06-19 DIAGNOSIS — M5417 Radiculopathy, lumbosacral region: Secondary | ICD-10-CM | POA: Diagnosis not present

## 2022-06-19 DIAGNOSIS — M5414 Radiculopathy, thoracic region: Secondary | ICD-10-CM | POA: Diagnosis not present

## 2022-06-19 DIAGNOSIS — M9905 Segmental and somatic dysfunction of pelvic region: Secondary | ICD-10-CM | POA: Diagnosis not present

## 2022-06-19 DIAGNOSIS — M5137 Other intervertebral disc degeneration, lumbosacral region: Secondary | ICD-10-CM | POA: Diagnosis not present

## 2022-06-19 DIAGNOSIS — M9903 Segmental and somatic dysfunction of lumbar region: Secondary | ICD-10-CM | POA: Diagnosis not present

## 2022-06-19 DIAGNOSIS — M9902 Segmental and somatic dysfunction of thoracic region: Secondary | ICD-10-CM | POA: Diagnosis not present

## 2022-06-23 DIAGNOSIS — M5414 Radiculopathy, thoracic region: Secondary | ICD-10-CM | POA: Diagnosis not present

## 2022-06-23 DIAGNOSIS — M5417 Radiculopathy, lumbosacral region: Secondary | ICD-10-CM | POA: Diagnosis not present

## 2022-06-23 DIAGNOSIS — M9905 Segmental and somatic dysfunction of pelvic region: Secondary | ICD-10-CM | POA: Diagnosis not present

## 2022-06-23 DIAGNOSIS — M9903 Segmental and somatic dysfunction of lumbar region: Secondary | ICD-10-CM | POA: Diagnosis not present

## 2022-06-23 DIAGNOSIS — M9902 Segmental and somatic dysfunction of thoracic region: Secondary | ICD-10-CM | POA: Diagnosis not present

## 2022-06-23 DIAGNOSIS — M5137 Other intervertebral disc degeneration, lumbosacral region: Secondary | ICD-10-CM | POA: Diagnosis not present

## 2022-06-25 DIAGNOSIS — M5417 Radiculopathy, lumbosacral region: Secondary | ICD-10-CM | POA: Diagnosis not present

## 2022-06-25 DIAGNOSIS — M5137 Other intervertebral disc degeneration, lumbosacral region: Secondary | ICD-10-CM | POA: Diagnosis not present

## 2022-06-25 DIAGNOSIS — M9902 Segmental and somatic dysfunction of thoracic region: Secondary | ICD-10-CM | POA: Diagnosis not present

## 2022-06-25 DIAGNOSIS — M9903 Segmental and somatic dysfunction of lumbar region: Secondary | ICD-10-CM | POA: Diagnosis not present

## 2022-06-25 DIAGNOSIS — M5414 Radiculopathy, thoracic region: Secondary | ICD-10-CM | POA: Diagnosis not present

## 2022-06-25 DIAGNOSIS — M9905 Segmental and somatic dysfunction of pelvic region: Secondary | ICD-10-CM | POA: Diagnosis not present

## 2022-07-01 ENCOUNTER — Ambulatory Visit (HOSPITAL_COMMUNITY)
Admission: RE | Admit: 2022-07-01 | Discharge: 2022-07-01 | Disposition: A | Discharge status: Home / Self Care | Payer: Medicare HMO | Source: Ambulatory Visit | Attending: Interventional Radiology | Admitting: Interventional Radiology

## 2022-07-01 DIAGNOSIS — I671 Cerebral aneurysm, nonruptured: Secondary | ICD-10-CM | POA: Insufficient documentation

## 2022-07-01 DIAGNOSIS — I611 Nontraumatic intracerebral hemorrhage in hemisphere, cortical: Secondary | ICD-10-CM | POA: Diagnosis not present

## 2022-07-01 DIAGNOSIS — G319 Degenerative disease of nervous system, unspecified: Secondary | ICD-10-CM | POA: Diagnosis not present

## 2022-07-07 ENCOUNTER — Telehealth (HOSPITAL_COMMUNITY): Payer: Self-pay

## 2022-07-07 NOTE — Telephone Encounter (Signed)
Called regarding recent imaging, no answer, left vm. AW  

## 2022-07-09 ENCOUNTER — Other Ambulatory Visit (HOSPITAL_COMMUNITY): Payer: Self-pay | Admitting: Interventional Radiology

## 2022-07-09 DIAGNOSIS — I671 Cerebral aneurysm, nonruptured: Secondary | ICD-10-CM

## 2022-07-15 ENCOUNTER — Telehealth: Payer: Self-pay | Admitting: Interventional Radiology

## 2022-07-15 ENCOUNTER — Ambulatory Visit (HOSPITAL_COMMUNITY)
Admission: RE | Admit: 2022-07-15 | Discharge: 2022-07-15 | Disposition: A | Discharge status: Home / Self Care | Payer: Medicare HMO | Source: Ambulatory Visit | Attending: Interventional Radiology | Admitting: Interventional Radiology

## 2022-07-15 DIAGNOSIS — M9903 Segmental and somatic dysfunction of lumbar region: Secondary | ICD-10-CM | POA: Diagnosis not present

## 2022-07-15 DIAGNOSIS — M5137 Other intervertebral disc degeneration, lumbosacral region: Secondary | ICD-10-CM | POA: Diagnosis not present

## 2022-07-15 DIAGNOSIS — M5414 Radiculopathy, thoracic region: Secondary | ICD-10-CM | POA: Diagnosis not present

## 2022-07-15 DIAGNOSIS — M9905 Segmental and somatic dysfunction of pelvic region: Secondary | ICD-10-CM | POA: Diagnosis not present

## 2022-07-15 DIAGNOSIS — M5417 Radiculopathy, lumbosacral region: Secondary | ICD-10-CM | POA: Diagnosis not present

## 2022-07-15 DIAGNOSIS — M9902 Segmental and somatic dysfunction of thoracic region: Secondary | ICD-10-CM | POA: Diagnosis not present
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
## 2022-07-21 DIAGNOSIS — M5417 Radiculopathy, lumbosacral region: Secondary | ICD-10-CM | POA: Diagnosis not present

## 2022-07-21 DIAGNOSIS — M9905 Segmental and somatic dysfunction of pelvic region: Secondary | ICD-10-CM | POA: Diagnosis not present

## 2022-07-21 DIAGNOSIS — M9903 Segmental and somatic dysfunction of lumbar region: Secondary | ICD-10-CM | POA: Diagnosis not present

## 2022-07-21 DIAGNOSIS — M5414 Radiculopathy, thoracic region: Secondary | ICD-10-CM | POA: Diagnosis not present

## 2022-07-21 DIAGNOSIS — M9902 Segmental and somatic dysfunction of thoracic region: Secondary | ICD-10-CM | POA: Diagnosis not present

## 2022-07-21 DIAGNOSIS — M5137 Other intervertebral disc degeneration, lumbosacral region: Secondary | ICD-10-CM | POA: Diagnosis not present

## 2022-08-10 ENCOUNTER — Telehealth: Payer: Self-pay | Admitting: Neurology

## 2022-08-10 NOTE — Telephone Encounter (Signed)
Received Surgical clearance form from [employer]. Form is in Dr. Georgie Chard box

## 2022-08-11 DIAGNOSIS — H00024 Hordeolum internum left upper eyelid: Secondary | ICD-10-CM | POA: Diagnosis not present

## 2022-08-12 DIAGNOSIS — Z79899 Other long term (current) drug therapy: Secondary | ICD-10-CM | POA: Diagnosis not present

## 2022-08-12 DIAGNOSIS — E785 Hyperlipidemia, unspecified: Secondary | ICD-10-CM | POA: Diagnosis not present

## 2022-08-17 DIAGNOSIS — H00024 Hordeolum internum left upper eyelid: Secondary | ICD-10-CM | POA: Diagnosis not present

## 2022-08-28 DIAGNOSIS — S12101A Unspecified nondisplaced fracture of second cervical vertebra, initial encounter for closed fracture: Secondary | ICD-10-CM | POA: Diagnosis not present

## 2022-08-28 DIAGNOSIS — M4316 Spondylolisthesis, lumbar region: Secondary | ICD-10-CM | POA: Diagnosis not present

## 2022-08-28 DIAGNOSIS — M542 Cervicalgia: Secondary | ICD-10-CM | POA: Diagnosis not present

## 2022-09-14 DIAGNOSIS — M810 Age-related osteoporosis without current pathological fracture: Secondary | ICD-10-CM | POA: Diagnosis not present

## 2022-09-18 DIAGNOSIS — J449 Chronic obstructive pulmonary disease, unspecified: Secondary | ICD-10-CM | POA: Diagnosis not present

## 2022-09-18 DIAGNOSIS — R109 Unspecified abdominal pain: Secondary | ICD-10-CM | POA: Diagnosis not present
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
## 2022-11-03 ENCOUNTER — Ambulatory Visit: Payer: Medicare HMO | Admitting: Neurology

## 2022-11-26 DIAGNOSIS — M542 Cervicalgia: Secondary | ICD-10-CM | POA: Diagnosis not present

## 2022-11-26 DIAGNOSIS — S12101A Unspecified nondisplaced fracture of second cervical vertebra, initial encounter for closed fracture: Secondary | ICD-10-CM | POA: Diagnosis not present

## 2022-11-26 DIAGNOSIS — M5106 Intervertebral disc disorders with myelopathy, lumbar region: Secondary | ICD-10-CM | POA: Diagnosis not present

## 2022-11-26 DIAGNOSIS — M4316 Spondylolisthesis, lumbar region: Secondary | ICD-10-CM | POA: Diagnosis not present

## 2022-11-26 DIAGNOSIS — M4326 Fusion of spine, lumbar region: Secondary | ICD-10-CM | POA: Diagnosis not present
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
## 2022-12-07 ENCOUNTER — Ambulatory Visit: Payer: Medicare HMO | Admitting: Neurology

## 2023-01-12 LAB — AMB RESULTS CONSOLE CBG: Glucose: 134

## 2023-01-28 DIAGNOSIS — M4316 Spondylolisthesis, lumbar region: Secondary | ICD-10-CM | POA: Diagnosis not present

## 2023-01-28 DIAGNOSIS — M542 Cervicalgia: Secondary | ICD-10-CM | POA: Diagnosis not present

## 2023-03-04 ENCOUNTER — Other Ambulatory Visit (HOSPITAL_COMMUNITY): Payer: Self-pay | Admitting: Interventional Radiology

## 2023-03-04 DIAGNOSIS — I671 Cerebral aneurysm, nonruptured: Secondary | ICD-10-CM

## 2023-03-15 ENCOUNTER — Telehealth (HOSPITAL_COMMUNITY): Payer: Self-pay

## 2023-03-15 NOTE — Telephone Encounter (Signed)
Returned call, no answer, left vm. AB

## 2023-03-16 DIAGNOSIS — M810 Age-related osteoporosis without current pathological fracture: Secondary | ICD-10-CM | POA: Diagnosis not present

## 2023-03-17 ENCOUNTER — Ambulatory Visit (HOSPITAL_COMMUNITY)
Admission: RE | Admit: 2023-03-17 | Discharge: 2023-03-17 | Disposition: A | Discharge status: Home / Self Care | Payer: Medicare HMO | Source: Ambulatory Visit | Attending: Interventional Radiology | Admitting: Interventional Radiology

## 2023-03-17 DIAGNOSIS — I6782 Cerebral ischemia: Secondary | ICD-10-CM | POA: Diagnosis not present

## 2023-03-17 DIAGNOSIS — I671 Cerebral aneurysm, nonruptured: Secondary | ICD-10-CM | POA: Diagnosis not present

## 2023-04-07 ENCOUNTER — Telehealth (HOSPITAL_COMMUNITY): Payer: Self-pay

## 2023-04-07 NOTE — Telephone Encounter (Signed)
Called pt regarding recent imaging, no answer, left vm. AB

## 2023-04-12 ENCOUNTER — Telehealth (HOSPITAL_COMMUNITY): Payer: Self-pay

## 2023-04-12 ENCOUNTER — Telehealth (HOSPITAL_COMMUNITY): Payer: Self-pay | Admitting: Student

## 2023-04-12 DIAGNOSIS — U071 COVID-19: Secondary | ICD-10-CM | POA: Diagnosis not present

## 2023-04-12 NOTE — Telephone Encounter (Signed)
Patient notified today that Dr. Corliss Skains reviewed her recent MRA Head/MR brain and is recommending routine imaging follow up in one year. Patient stated she has been having left-sided headaches with pain sensations behind her left eye, left ear and the top of her head. Patient also complaining of bloody noses. Patient is not on anticoagulation/antiplatelet medication.  Recent imaging re-reviewed with Dr. Corliss Skains who states there are no acute findings and she should follow up with her PCP. Patient verbalized understanding.  Alwyn Ren, [place] [phone] 04/12/2023, 1:40 PM

## 2023-04-12 NOTE — Telephone Encounter (Signed)
Pt agreed to f/u in 1 year with an MRA. She is concerned about her frequent headaches on the left side. I've sent a message to our neuro PA today to advise. AB

## 2023-04-22 DIAGNOSIS — J0110 Acute frontal sinusitis, unspecified: Secondary | ICD-10-CM | POA: Diagnosis not present

## 2023-04-22 DIAGNOSIS — J4521 Mild intermittent asthma with (acute) exacerbation: Secondary | ICD-10-CM | POA: Diagnosis not present

## 2023-04-22 DIAGNOSIS — U071 COVID-19: Secondary | ICD-10-CM | POA: Diagnosis not present

## 2023-04-22 DIAGNOSIS — I700 Atherosclerosis of aorta: Secondary | ICD-10-CM | POA: Diagnosis not present

## 2023-04-26 DIAGNOSIS — H5203 Hypermetropia, bilateral: Secondary | ICD-10-CM | POA: Diagnosis not present

## 2023-04-27 DIAGNOSIS — H524 Presbyopia: Secondary | ICD-10-CM | POA: Diagnosis not present

## 2023-04-30 DIAGNOSIS — M4316 Spondylolisthesis, lumbar region: Secondary | ICD-10-CM | POA: Diagnosis not present

## 2023-04-30 DIAGNOSIS — Z6828 Body mass index (BMI) 28.0-28.9, adult: Secondary | ICD-10-CM | POA: Diagnosis not present

## 2023-04-30 DIAGNOSIS — M542 Cervicalgia: Secondary | ICD-10-CM | POA: Diagnosis not present

## 2023-06-21 DIAGNOSIS — E785 Hyperlipidemia, unspecified: Secondary | ICD-10-CM | POA: Diagnosis not present

## 2023-06-21 DIAGNOSIS — G629 Polyneuropathy, unspecified: Secondary | ICD-10-CM | POA: Diagnosis not present

## 2023-06-21 DIAGNOSIS — E559 Vitamin D deficiency, unspecified: Secondary | ICD-10-CM | POA: Diagnosis not present

## 2023-06-21 DIAGNOSIS — F3342 Major depressive disorder, recurrent, in full remission: Secondary | ICD-10-CM | POA: Diagnosis not present

## 2023-06-21 DIAGNOSIS — Z23 Encounter for immunization: Secondary | ICD-10-CM | POA: Diagnosis not present

## 2023-06-21 DIAGNOSIS — Z8673 Personal history of transient ischemic attack (TIA), and cerebral infarction without residual deficits: Secondary | ICD-10-CM | POA: Diagnosis not present

## 2023-06-21 DIAGNOSIS — Z0000 Encounter for general adult medical examination without abnormal findings: Secondary | ICD-10-CM | POA: Diagnosis not present

## 2023-06-21 DIAGNOSIS — I700 Atherosclerosis of aorta: Secondary | ICD-10-CM | POA: Diagnosis not present

## 2023-06-21 DIAGNOSIS — M810 Age-related osteoporosis without current pathological fracture: Secondary | ICD-10-CM | POA: Diagnosis not present

## 2023-06-21 DIAGNOSIS — I671 Cerebral aneurysm, nonruptured: Secondary | ICD-10-CM | POA: Diagnosis not present

## 2023-09-10 DIAGNOSIS — M9903 Segmental and somatic dysfunction of lumbar region: Secondary | ICD-10-CM | POA: Diagnosis not present

## 2023-09-10 DIAGNOSIS — M5414 Radiculopathy, thoracic region: Secondary | ICD-10-CM | POA: Diagnosis not present

## 2023-09-10 DIAGNOSIS — M9902 Segmental and somatic dysfunction of thoracic region: Secondary | ICD-10-CM | POA: Diagnosis not present

## 2023-09-10 DIAGNOSIS — M5432 Sciatica, left side: Secondary | ICD-10-CM | POA: Diagnosis not present

## 2023-09-10 DIAGNOSIS — M9905 Segmental and somatic dysfunction of pelvic region: Secondary | ICD-10-CM | POA: Diagnosis not present

## 2023-09-10 DIAGNOSIS — M5417 Radiculopathy, lumbosacral region: Secondary | ICD-10-CM | POA: Diagnosis not present

## 2023-09-20 DIAGNOSIS — M810 Age-related osteoporosis without current pathological fracture: Secondary | ICD-10-CM | POA: Diagnosis not present

## 2023-12-21 DIAGNOSIS — J0110 Acute frontal sinusitis, unspecified: Secondary | ICD-10-CM | POA: Diagnosis not present

## 2023-12-21 DIAGNOSIS — R519 Headache, unspecified: Secondary | ICD-10-CM | POA: Diagnosis not present

## 2023-12-21 DIAGNOSIS — F4321 Adjustment disorder with depressed mood: Secondary | ICD-10-CM | POA: Diagnosis not present

## 2023-12-21 DIAGNOSIS — J45909 Unspecified asthma, uncomplicated: Secondary | ICD-10-CM | POA: Diagnosis not present

## 2024-01-04 ENCOUNTER — Other Ambulatory Visit: Payer: Self-pay | Admitting: Family Medicine

## 2024-01-04 ENCOUNTER — Ambulatory Visit
Admission: RE | Admit: 2024-01-04 | Discharge: 2024-01-04 | Disposition: A | Discharge status: Home / Self Care | Source: Ambulatory Visit | Attending: Family Medicine | Admitting: Family Medicine

## 2024-01-04 ENCOUNTER — Encounter: Payer: Self-pay | Admitting: Family Medicine

## 2024-01-04 DIAGNOSIS — R519 Headache, unspecified: Secondary | ICD-10-CM

## 2024-01-17 DIAGNOSIS — J4521 Mild intermittent asthma with (acute) exacerbation: Secondary | ICD-10-CM | POA: Diagnosis not present

## 2024-01-17 DIAGNOSIS — E785 Hyperlipidemia, unspecified: Secondary | ICD-10-CM | POA: Diagnosis not present

## 2024-01-17 DIAGNOSIS — F3342 Major depressive disorder, recurrent, in full remission: Secondary | ICD-10-CM | POA: Diagnosis not present

## 2024-01-17 DIAGNOSIS — M810 Age-related osteoporosis without current pathological fracture: Secondary | ICD-10-CM | POA: Diagnosis not present

## 2024-02-17 DIAGNOSIS — J4521 Mild intermittent asthma with (acute) exacerbation: Secondary | ICD-10-CM | POA: Diagnosis not present

## 2024-02-17 DIAGNOSIS — F3342 Major depressive disorder, recurrent, in full remission: Secondary | ICD-10-CM | POA: Diagnosis not present

## 2024-02-17 DIAGNOSIS — M810 Age-related osteoporosis without current pathological fracture: Secondary | ICD-10-CM | POA: Diagnosis not present

## 2024-02-17 DIAGNOSIS — E785 Hyperlipidemia, unspecified: Secondary | ICD-10-CM | POA: Diagnosis not present

## 2024-02-18 DIAGNOSIS — J3489 Other specified disorders of nose and nasal sinuses: Secondary | ICD-10-CM | POA: Diagnosis not present

## 2024-02-18 DIAGNOSIS — K219 Gastro-esophageal reflux disease without esophagitis: Secondary | ICD-10-CM | POA: Diagnosis not present

## 2024-03-19 DIAGNOSIS — J4521 Mild intermittent asthma with (acute) exacerbation: Secondary | ICD-10-CM | POA: Diagnosis not present

## 2024-03-19 DIAGNOSIS — F3342 Major depressive disorder, recurrent, in full remission: Secondary | ICD-10-CM | POA: Diagnosis not present

## 2024-03-19 DIAGNOSIS — E785 Hyperlipidemia, unspecified: Secondary | ICD-10-CM | POA: Diagnosis not present

## 2024-03-19 DIAGNOSIS — M810 Age-related osteoporosis without current pathological fracture: Secondary | ICD-10-CM | POA: Diagnosis not present

## 2024-04-20 DIAGNOSIS — M5414 Radiculopathy, thoracic region: Secondary | ICD-10-CM | POA: Diagnosis not present

## 2024-04-20 DIAGNOSIS — M9905 Segmental and somatic dysfunction of pelvic region: Secondary | ICD-10-CM | POA: Diagnosis not present

## 2024-04-20 DIAGNOSIS — M9903 Segmental and somatic dysfunction of lumbar region: Secondary | ICD-10-CM | POA: Diagnosis not present

## 2024-04-20 DIAGNOSIS — M51362 Other intervertebral disc degeneration, lumbar region with discogenic back pain and lower extremity pain: Secondary | ICD-10-CM | POA: Diagnosis not present

## 2024-04-20 DIAGNOSIS — M9901 Segmental and somatic dysfunction of cervical region: Secondary | ICD-10-CM | POA: Diagnosis not present

## 2024-04-20 DIAGNOSIS — M9902 Segmental and somatic dysfunction of thoracic region: Secondary | ICD-10-CM | POA: Diagnosis not present

## 2024-04-20 DIAGNOSIS — M5417 Radiculopathy, lumbosacral region: Secondary | ICD-10-CM | POA: Diagnosis not present

## 2024-04-21 DIAGNOSIS — M810 Age-related osteoporosis without current pathological fracture: Secondary | ICD-10-CM | POA: Diagnosis not present

## 2024-04-21 DIAGNOSIS — M5414 Radiculopathy, thoracic region: Secondary | ICD-10-CM | POA: Diagnosis not present

## 2024-04-21 DIAGNOSIS — M9905 Segmental and somatic dysfunction of pelvic region: Secondary | ICD-10-CM | POA: Diagnosis not present

## 2024-04-21 DIAGNOSIS — M51362 Other intervertebral disc degeneration, lumbar region with discogenic back pain and lower extremity pain: Secondary | ICD-10-CM | POA: Diagnosis not present

## 2024-04-21 DIAGNOSIS — M9903 Segmental and somatic dysfunction of lumbar region: Secondary | ICD-10-CM | POA: Diagnosis not present

## 2024-04-21 DIAGNOSIS — M9902 Segmental and somatic dysfunction of thoracic region: Secondary | ICD-10-CM | POA: Diagnosis not present

## 2024-04-21 DIAGNOSIS — M9901 Segmental and somatic dysfunction of cervical region: Secondary | ICD-10-CM | POA: Diagnosis not present

## 2024-04-21 DIAGNOSIS — M5417 Radiculopathy, lumbosacral region: Secondary | ICD-10-CM | POA: Diagnosis not present

## 2024-05-03 ENCOUNTER — Ambulatory Visit: Admitting: Neurology

## 2024-05-15 DIAGNOSIS — M9901 Segmental and somatic dysfunction of cervical region: Secondary | ICD-10-CM | POA: Diagnosis not present

## 2024-05-15 DIAGNOSIS — M51362 Other intervertebral disc degeneration, lumbar region with discogenic back pain and lower extremity pain: Secondary | ICD-10-CM | POA: Diagnosis not present

## 2024-05-15 DIAGNOSIS — M9905 Segmental and somatic dysfunction of pelvic region: Secondary | ICD-10-CM | POA: Diagnosis not present

## 2024-05-15 DIAGNOSIS — M5417 Radiculopathy, lumbosacral region: Secondary | ICD-10-CM | POA: Diagnosis not present

## 2024-05-15 DIAGNOSIS — M5414 Radiculopathy, thoracic region: Secondary | ICD-10-CM | POA: Diagnosis not present

## 2024-05-15 DIAGNOSIS — M9903 Segmental and somatic dysfunction of lumbar region: Secondary | ICD-10-CM | POA: Diagnosis not present

## 2024-05-15 DIAGNOSIS — M9902 Segmental and somatic dysfunction of thoracic region: Secondary | ICD-10-CM | POA: Diagnosis not present

## 2024-05-16 DIAGNOSIS — M5417 Radiculopathy, lumbosacral region: Secondary | ICD-10-CM | POA: Diagnosis not present

## 2024-05-16 DIAGNOSIS — M51362 Other intervertebral disc degeneration, lumbar region with discogenic back pain and lower extremity pain: Secondary | ICD-10-CM | POA: Diagnosis not present

## 2024-05-16 DIAGNOSIS — M9901 Segmental and somatic dysfunction of cervical region: Secondary | ICD-10-CM | POA: Diagnosis not present

## 2024-05-16 DIAGNOSIS — M9902 Segmental and somatic dysfunction of thoracic region: Secondary | ICD-10-CM | POA: Diagnosis not present

## 2024-05-16 DIAGNOSIS — M9903 Segmental and somatic dysfunction of lumbar region: Secondary | ICD-10-CM | POA: Diagnosis not present

## 2024-05-16 DIAGNOSIS — M5414 Radiculopathy, thoracic region: Secondary | ICD-10-CM | POA: Diagnosis not present

## 2024-05-16 DIAGNOSIS — M9905 Segmental and somatic dysfunction of pelvic region: Secondary | ICD-10-CM | POA: Diagnosis not present
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
## 2024-05-17 ENCOUNTER — Ambulatory Visit: Admitting: Neurology

## 2024-05-17 ENCOUNTER — Encounter: Payer: Self-pay | Admitting: Neurology

## 2024-05-17 VITALS — BP 144/82 | HR 65 | Ht 59.0 in | Wt 136.2 lb

## 2024-05-17 DIAGNOSIS — M5412 Radiculopathy, cervical region: Secondary | ICD-10-CM | POA: Diagnosis not present

## 2024-05-17 DIAGNOSIS — R208 Other disturbances of skin sensation: Secondary | ICD-10-CM | POA: Diagnosis not present

## 2024-05-17 DIAGNOSIS — S12100D Unspecified displaced fracture of second cervical vertebra, subsequent encounter for fracture with routine healing: Secondary | ICD-10-CM

## 2024-05-17 DIAGNOSIS — H02832 Dermatochalasis of right lower eyelid: Secondary | ICD-10-CM | POA: Diagnosis not present

## 2024-05-17 DIAGNOSIS — H02835 Dermatochalasis of left lower eyelid: Secondary | ICD-10-CM | POA: Diagnosis not present

## 2024-05-17 DIAGNOSIS — R519 Headache, unspecified: Secondary | ICD-10-CM

## 2024-05-17 DIAGNOSIS — H52203 Unspecified astigmatism, bilateral: Secondary | ICD-10-CM | POA: Diagnosis not present

## 2024-05-17 DIAGNOSIS — Z981 Arthrodesis status: Secondary | ICD-10-CM | POA: Diagnosis not present

## 2024-05-17 DIAGNOSIS — G8929 Other chronic pain: Secondary | ICD-10-CM | POA: Diagnosis not present

## 2024-05-17 DIAGNOSIS — H5203 Hypermetropia, bilateral: Secondary | ICD-10-CM | POA: Diagnosis not present

## 2024-05-17 DIAGNOSIS — H524 Presbyopia: Secondary | ICD-10-CM | POA: Diagnosis not present

## 2024-06-01 DIAGNOSIS — M9902 Segmental and somatic dysfunction of thoracic region: Secondary | ICD-10-CM | POA: Diagnosis not present

## 2024-06-01 DIAGNOSIS — M9903 Segmental and somatic dysfunction of lumbar region: Secondary | ICD-10-CM | POA: Diagnosis not present

## 2024-06-01 DIAGNOSIS — M51362 Other intervertebral disc degeneration, lumbar region with discogenic back pain and lower extremity pain: Secondary | ICD-10-CM | POA: Diagnosis not present

## 2024-06-01 DIAGNOSIS — M5417 Radiculopathy, lumbosacral region: Secondary | ICD-10-CM | POA: Diagnosis not present

## 2024-06-01 DIAGNOSIS — M5414 Radiculopathy, thoracic region: Secondary | ICD-10-CM | POA: Diagnosis not present

## 2024-06-01 DIAGNOSIS — M9905 Segmental and somatic dysfunction of pelvic region: Secondary | ICD-10-CM | POA: Diagnosis not present

## 2024-06-01 DIAGNOSIS — M9901 Segmental and somatic dysfunction of cervical region: Secondary | ICD-10-CM | POA: Diagnosis not present

## 2024-06-02 DIAGNOSIS — M5417 Radiculopathy, lumbosacral region: Secondary | ICD-10-CM | POA: Diagnosis not present

## 2024-06-02 DIAGNOSIS — M9901 Segmental and somatic dysfunction of cervical region: Secondary | ICD-10-CM | POA: Diagnosis not present

## 2024-06-02 DIAGNOSIS — M5414 Radiculopathy, thoracic region: Secondary | ICD-10-CM | POA: Diagnosis not present

## 2024-06-02 DIAGNOSIS — M9902 Segmental and somatic dysfunction of thoracic region: Secondary | ICD-10-CM | POA: Diagnosis not present

## 2024-06-02 DIAGNOSIS — M9903 Segmental and somatic dysfunction of lumbar region: Secondary | ICD-10-CM | POA: Diagnosis not present

## 2024-06-02 DIAGNOSIS — M51362 Other intervertebral disc degeneration, lumbar region with discogenic back pain and lower extremity pain: Secondary | ICD-10-CM | POA: Diagnosis not present

## 2024-06-02 DIAGNOSIS — M9905 Segmental and somatic dysfunction of pelvic region: Secondary | ICD-10-CM | POA: Diagnosis not present

## 2024-06-20 DIAGNOSIS — M25511 Pain in right shoulder: Secondary | ICD-10-CM | POA: Diagnosis not present

## 2024-06-20 DIAGNOSIS — R1085 Abdominal pain of multiple sites: Secondary | ICD-10-CM | POA: Diagnosis not present
